# Patient Record
Sex: Female | Born: 1950 | Race: White | Hispanic: No | Marital: Single | Smoking: Former smoker
Health system: Southern US, Community
[De-identification: ages and names within clinical notes are randomized; demographics above are authoritative.]

## PROBLEM LIST (undated history)

## (undated) DIAGNOSIS — G4733 Obstructive sleep apnea (adult) (pediatric): Secondary | ICD-10-CM

## (undated) DIAGNOSIS — I10 Essential (primary) hypertension: Secondary | ICD-10-CM

## (undated) DIAGNOSIS — Z9989 Dependence on other enabling machines and devices: Secondary | ICD-10-CM

## (undated) DIAGNOSIS — D649 Anemia, unspecified: Secondary | ICD-10-CM

## (undated) DIAGNOSIS — F32A Depression, unspecified: Secondary | ICD-10-CM

## (undated) DIAGNOSIS — R011 Cardiac murmur, unspecified: Secondary | ICD-10-CM

## (undated) DIAGNOSIS — E785 Hyperlipidemia, unspecified: Secondary | ICD-10-CM

## (undated) DIAGNOSIS — F329 Major depressive disorder, single episode, unspecified: Secondary | ICD-10-CM

## (undated) DIAGNOSIS — E119 Type 2 diabetes mellitus without complications: Secondary | ICD-10-CM

## (undated) DIAGNOSIS — M1990 Unspecified osteoarthritis, unspecified site: Secondary | ICD-10-CM

## (undated) HISTORY — DX: Obstructive sleep apnea (adult) (pediatric): G47.33

## (undated) HISTORY — PX: OTHER SURGICAL HISTORY: SHX169

## (undated) HISTORY — DX: Morbid (severe) obesity due to excess calories: E66.01

## (undated) HISTORY — DX: Type 2 diabetes mellitus without complications: E11.9

## (undated) HISTORY — DX: Hyperlipidemia, unspecified: E78.5

## (undated) HISTORY — DX: Dependence on other enabling machines and devices: Z99.89

## (undated) HISTORY — PX: DILATION AND CURETTAGE OF UTERUS: SHX78

## (undated) HISTORY — DX: Cardiac murmur, unspecified: R01.1

## (undated) HISTORY — DX: Essential (primary) hypertension: I10

---

## 1975-08-04 HISTORY — PX: TONSILLECTOMY: SUR1361

## 2007-11-15 ENCOUNTER — Ambulatory Visit: Payer: Self-pay

## 2008-03-06 ENCOUNTER — Ambulatory Visit: Payer: Self-pay | Admitting: General Surgery

## 2008-03-06 ENCOUNTER — Other Ambulatory Visit: Payer: Self-pay

## 2008-03-19 ENCOUNTER — Ambulatory Visit: Payer: Self-pay | Admitting: General Surgery

## 2008-08-03 HISTORY — PX: CHOLECYSTECTOMY: SHX55

## 2012-11-11 ENCOUNTER — Other Ambulatory Visit (INDEPENDENT_AMBULATORY_CARE_PROVIDER_SITE_OTHER): Payer: Self-pay

## 2012-11-11 ENCOUNTER — Encounter (INDEPENDENT_AMBULATORY_CARE_PROVIDER_SITE_OTHER): Payer: Self-pay | Admitting: Surgery

## 2012-11-11 ENCOUNTER — Ambulatory Visit (INDEPENDENT_AMBULATORY_CARE_PROVIDER_SITE_OTHER): Payer: BC Managed Care – PPO | Admitting: Surgery

## 2012-11-11 DIAGNOSIS — Z6841 Body Mass Index (BMI) 40.0 and over, adult: Secondary | ICD-10-CM

## 2012-11-11 DIAGNOSIS — E119 Type 2 diabetes mellitus without complications: Secondary | ICD-10-CM

## 2012-11-11 DIAGNOSIS — I10 Essential (primary) hypertension: Secondary | ICD-10-CM

## 2012-11-11 DIAGNOSIS — G4733 Obstructive sleep apnea (adult) (pediatric): Secondary | ICD-10-CM

## 2012-11-11 NOTE — Progress Notes (Signed)
Re:   Leslie Krueger DOB:   03/04/1951 MRN:   [id number]  ASSESSMENT AND PLAN: 1.  Morbid obesity  Weight - 311, BMI - 47  She has already done a 6 month supervised weight loss program in anticipation of surgery by Ninfa Linden, FNP.  Per the 1991 NIH Consensus Statement, the patient is a candidate for bariatric surgery.  The patient attended our initial information session and reviewed the types of bariatric surgery.    The patient is interested in the Roux en Y Gastric Bypass.  I discussed with the patient the indications and risks of bariatric surgery.  The potential risks of surgery include, but are not limited to, bleeding, infection, leak from the bowel, DVT and PE, open surgery, long term nutrition consequences, and death.  The patient understands the importance of compliance and long term follow-up with our group after surgery.  From here we will obtain lab tests, x-rays, nutrition consult, and psych consult.  One issue may be her insurance company and its rules.    2.  Diabetes mellitus - since 12/27/06 3.  History of heart murmur. 4.  Sleep apnea - on CPAP since 27-Dec-2003 5.  Hypertension since 12-27-2003 6.  Gout 7.  Back trouble  Attributed to weight - had PT in [place], but this has been > 6 years ago. 8. Depression 9.  Bursitis/tendonitis - right knee and ankle  [employer] is [employer] of [place] - they only cover this till age 51.  Her birthday is [birth year], which makes for a tight schedule.  Odd]  Chief Complaint  Patient presents with  . Bariatric Pre-op   REFERRING PHYSICIAN: Ninfa Linden, FNP  HISTORY OF PRESENT ILLNESS: Leslie Krueger is a 62 y.o. (DOB: [birth year])  white  female whose primary care physician is Ninfa Linden, FNP and comes to me today for consideration of bariatric surgery.  Patient's husband died in 12-26-04. Her daughter died a few years later for melanoma. She moved from [place] to [place] about 27-Dec-2006.  She has tried multiple diets  including Weight Watchers, [place], Slim fast, diabetic diets, and low-calorie diets. She has tried over-the-counter diet pills. She's had some success with weight loss with this [employer] and the diabetic diets. She just had trouble staying on the diet. She's also had trouble with her right knee and ankle which limit her physical activity.  She has been to one of our information sessions, but she can not remember who spoke. She has a cousin who weighed 400-500 pounds who had gastric bypass surgery. He had a lot of nausea early after the surgery, that has gotten better and has successfully lost more than 200 pounds.    Past Medical History  Diagnosis Date  . Diabetes mellitus without complication   . Heart murmur       Past Surgical History  Procedure Laterality Date  . Tonsillectomy    . Cholecystectomy    . Dilation and curettage of uterus        Current Outpatient Prescriptions  Medication Sig Dispense Refill  . allopurinol (ZYLOPRIM) 300 MG tablet Take 300 mg by mouth daily.      Marland Kitchen aspirin 81 MG tablet Take 81 mg by mouth daily.      Marland Kitchen atorvastatin (LIPITOR) 40 MG tablet Take 40 mg by mouth daily.      . ergocalciferol (VITAMIN D2) 50000 UNITS capsule Take 50,000 Units by mouth once a week.      Marland Kitchen  FLUoxetine (PROZAC) 40 MG capsule Take 40 mg by mouth daily.      . furosemide (LASIX) 40 MG tablet Take 40 mg by mouth daily.      Marland Kitchen lisinopril (PRINIVIL,ZESTRIL) 20 MG tablet Take 20 mg by mouth daily.      . metFORMIN (GLUMETZA) 1000 MG (MOD) 24 hr tablet Take 1,000 mg by mouth daily with breakfast.      . pioglitazone (ACTOS) 30 MG tablet Take 30 mg by mouth daily.      . potassium chloride (K-DUR,KLOR-CON) 10 MEQ tablet Take 10 mEq by mouth 2 (two) times daily.       No current facility-administered medications for this visit.      Allergies  Allergen Reactions  . Mycinette (Phenol) Nausea Only    REVIEW OF SYSTEMS: Skin:  No history of rash.  No history of  abnormal moles. Infection:  No history of hepatitis or HIV.  No history of MRSA. Neurologic:  No history of stroke.  No history of seizure.  No history of headaches. Cardiac:  Hypertension since 12-18-2003.  History of heart murmur.  Had a echo in [place] within the last few months, but nothing significant seen.   No history of seeing a cardiologist. Pulmonary:  Sleep apnea since Dec 18, 2003 - tested again last year.  Endocrine:  Diabetes since 12/18/06.   No thyroid disease. Gastrointestinal:  No history of stomach disease.  No history of liver disease.  Cholecystectomy 2010 in [place].  No history of pancreas disease.  No history of colon disease. Last colonoscopy about 10 years ago. Urologic:  No history of kidney stones.  No history of bladder infections. GYN:  G7, P2.  But daughter died of melanoma at age 47 Musculoskeletal:  Back trouble, but not seeing anyone at this time.  Dismissed as problem secondary to weight. Gout since 12/18/2003 - controled on Allopurinol.  Bursitis/tendonitis - right knee and ankle Hematologic:  No bleeding disorder.  No history of anemia.  Not anticoagulated. Psycho-social:  The patient is oriented.   She suffers from depression.  We talked about how this could affect her weight, though she downplayed it the more we talked.  SOCIAL and FAMILY HISTORY: Single (husband died 12/18/2006) Works in Mudlogger at [employer] One daughter died of melanoma at age 55 Has son who lives in [place] She lives beside her brother. She will bring a niece with her to her next visit.  PHYSICAL EXAM: BP 123/86  Pulse 70  Temp(Src) 97.1 F (36.2 C) (Temporal)  Resp 12  Ht 5\' 8"  (1.727 m)  Wt 311 lb (141.069 kg)  BMI 47.3 kg/m2  General: WN obese WF who is alert and generally healthy appearing.  HEENT: Normal. Pupils equal. Neck: Supple. No mass.  No thyroid mass. Lymph Nodes:  No supraclavicular or cervical nodes. Lungs: Clear to auscultation and symmetric breath sounds. Heart:   RRR. No murmur or rub.  She said they did an echo for her heart murmur, but I don't hear one. Abdomen: Soft. No mass. No tenderness. No hernia. Normal bowel sounds.  She is about 1/2 apple and 1/2 pear. Rectal: No rectal mass.  Guaiac neg stool. Extremities:  Good strength and ROM  in upper and lower extremities. Neurologic:  Grossly intact to motor and sensory function. Psychiatric: Has normal mood and affect. Behavior is normal.   DATA REVIEWED: Notes in chart and labs from K. Cassell Smiles, MD,  [facility], [place] [address]  [address]   [place], [place]    [address] Phone:  [phone] FAX:  [phone]  

## 2012-11-23 ENCOUNTER — Encounter (HOSPITAL_COMMUNITY)
Admission: RE | Disposition: A | Discharge status: Home / Self Care | Payer: Self-pay | Source: Ambulatory Visit | Attending: Surgery

## 2012-11-23 ENCOUNTER — Ambulatory Visit (HOSPITAL_COMMUNITY)
Admission: RE | Admit: 2012-11-23 | Discharge: 2012-11-23 | Disposition: A | Discharge status: Home / Self Care | Payer: BC Managed Care – PPO | Source: Ambulatory Visit | Attending: Surgery | Admitting: Surgery

## 2012-11-23 HISTORY — PX: BREATH TEK H PYLORI: SHX5422

## 2012-11-23 SURGERY — BREATH TEST, FOR HELICOBACTER PYLORI

## 2012-11-24 ENCOUNTER — Encounter (HOSPITAL_COMMUNITY): Payer: Self-pay | Admitting: Surgery

## 2012-11-28 ENCOUNTER — Encounter (INDEPENDENT_AMBULATORY_CARE_PROVIDER_SITE_OTHER): Payer: Self-pay

## 2012-11-29 ENCOUNTER — Ambulatory Visit (HOSPITAL_COMMUNITY)
Admission: RE | Admit: 2012-11-29 | Discharge: 2012-11-29 | Disposition: A | Discharge status: Home / Self Care | Payer: BC Managed Care – PPO | Source: Ambulatory Visit | Attending: Surgery | Admitting: Surgery

## 2012-11-29 ENCOUNTER — Other Ambulatory Visit: Payer: Self-pay

## 2012-11-29 DIAGNOSIS — R011 Cardiac murmur, unspecified: Secondary | ICD-10-CM | POA: Insufficient documentation

## 2012-11-29 DIAGNOSIS — Z9089 Acquired absence of other organs: Secondary | ICD-10-CM | POA: Insufficient documentation

## 2012-11-29 DIAGNOSIS — F3289 Other specified depressive episodes: Secondary | ICD-10-CM | POA: Insufficient documentation

## 2012-11-29 DIAGNOSIS — Z6841 Body Mass Index (BMI) 40.0 and over, adult: Secondary | ICD-10-CM | POA: Insufficient documentation

## 2012-11-29 DIAGNOSIS — G4730 Sleep apnea, unspecified: Secondary | ICD-10-CM | POA: Insufficient documentation

## 2012-11-29 DIAGNOSIS — M76899 Other specified enthesopathies of unspecified lower limb, excluding foot: Secondary | ICD-10-CM | POA: Insufficient documentation

## 2012-11-29 DIAGNOSIS — M109 Gout, unspecified: Secondary | ICD-10-CM | POA: Insufficient documentation

## 2012-11-29 DIAGNOSIS — I10 Essential (primary) hypertension: Secondary | ICD-10-CM | POA: Insufficient documentation

## 2012-11-29 DIAGNOSIS — E119 Type 2 diabetes mellitus without complications: Secondary | ICD-10-CM | POA: Insufficient documentation

## 2012-11-29 DIAGNOSIS — K219 Gastro-esophageal reflux disease without esophagitis: Secondary | ICD-10-CM | POA: Insufficient documentation

## 2012-11-29 DIAGNOSIS — F329 Major depressive disorder, single episode, unspecified: Secondary | ICD-10-CM | POA: Insufficient documentation

## 2012-11-29 DIAGNOSIS — K449 Diaphragmatic hernia without obstruction or gangrene: Secondary | ICD-10-CM | POA: Insufficient documentation

## 2012-11-30 ENCOUNTER — Encounter: Payer: BC Managed Care – PPO | Attending: Surgery | Admitting: *Deleted

## 2012-11-30 ENCOUNTER — Encounter: Payer: Self-pay | Admitting: *Deleted

## 2012-11-30 DIAGNOSIS — Z713 Dietary counseling and surveillance: Secondary | ICD-10-CM | POA: Insufficient documentation

## 2012-11-30 DIAGNOSIS — Z01818 Encounter for other preprocedural examination: Secondary | ICD-10-CM | POA: Insufficient documentation

## 2012-11-30 DIAGNOSIS — E669 Obesity, unspecified: Secondary | ICD-10-CM | POA: Insufficient documentation

## 2012-11-30 NOTE — Progress Notes (Signed)
  Pre-Op Assessment Visit:  Pre-Operative RYGB Surgery  Medical Nutrition Therapy:  Appt start time: 0915   End time:  1015.  Patient was seen on 11/30/2012 for Pre-Operative RYGB Nutrition Assessment. Assessment and letter of approval faxed to [facility] Bariatric Surgery Program coordinator on 11/30/2012.  Approval letter sent to [facility] and will be available in the chart under the media tab.  Handouts given during visit include:  Pre-Op Goals   Bariatric Surgery Protein Shakes  Patient to call for Pre-Op and Post-Op Nutrition Education at [facility] when surgery is scheduled.

## 2012-11-30 NOTE — Patient Instructions (Addendum)
   Follow Pre-Op Nutrition Goals to prepare for Gastric Bypass Surgery.   Call the [facility] at [phone] once you have been given your surgery date to enrolled in the Pre-Op Nutrition Class. You will need to attend this nutrition class 3-4 weeks prior to your surgery. 

## 2012-12-16 ENCOUNTER — Encounter (INDEPENDENT_AMBULATORY_CARE_PROVIDER_SITE_OTHER): Payer: Self-pay

## 2013-03-23 ENCOUNTER — Encounter: Payer: BC Managed Care – PPO | Attending: Surgery | Admitting: *Deleted

## 2013-03-23 DIAGNOSIS — Z713 Dietary counseling and surveillance: Secondary | ICD-10-CM | POA: Insufficient documentation

## 2013-03-23 NOTE — Progress Notes (Addendum)
Bariatric Class:  Appt start time: 0830 end time:  0930.  Pre-Operative Nutrition Class  Patient was seen on 03/23/2013 for Pre-Operative Bariatric Surgery Education at [facility].   Surgery date: 04/11/13 Surgery type: RYGB Start weight at [facility]: 312.2 lbs (11/30/12)  Weight today: 315.0 lbs Weight change: n/a Total weight lost: n/a  TANITA  BODY COMP RESULTS  03/23/13   BMI (kg/m^2) 47.9   Fat Mass (lbs) 163.0   Fat Free Mass (lbs) 152.0   Total Body Water (lbs) 111.0   Samples given per MNT protocol; Patient educated on appropriate usage: Bariatric Advantage Multivitamin Lot # [id number];  Exp: 06/15  Bariatric Advantage Calcium Citrate Lot # [id number]; Exp: 08/15  Bariatric Advantage Sublingual B12 Lot # [id number]; Exp: 10/15  Celebrate Vitamins Multivitamin Lot # [id number]; Exp: 01/16  Celebrate Vitamins Calcium Citrate Lot # [id number];  Exp: 01/16  BariActiv Vitamins Multivitamin Lot # [id number]; Exp: 05/16  BariActiv Vitamins Calcium Citrate Lot # [id number]; Exp: 05/16  BariActiv Vitamins Iron + Vit C/Mg Lot # [id number]; Exp: 05/16  Unjury Protein Powder Lot # [id number]; Exp: 09/15 Lot # [id number]; Exp: 12/15  The following the learning objective met by the patient during this course:  Identify Pre-Op Dietary Goals and will begin 2 weeks pre-operatively  Identify appropriate sources of fluids and proteins   State protein recommendations and appropriate sources pre and post-operatively  Identify Post-Operative Dietary Goals and will follow for 2 weeks post-operatively  Identify appropriate multivitamin and calcium sources  Describe the need for physical activity post-operatively and will follow MD recommendations  State when to call healthcare provider regarding medication questions or post-operative complications  Handouts given during class include:  Pre-Op Bariatric Surgery Diet Handout  Protein Shake Handout  Post-Op Bariatric Surgery  Nutrition Handout  BELT Program Information Flyer  Support Group Information Flyer  [facility] Bariatric Supplements Price List  Follow-Up Plan: Patient will follow-up at [facility] 2 weeks post operatively for diet advancement per MD.

## 2013-03-29 ENCOUNTER — Telehealth (INDEPENDENT_AMBULATORY_CARE_PROVIDER_SITE_OTHER): Payer: Self-pay

## 2013-03-29 NOTE — Telephone Encounter (Signed)
The hospital has been calling and leaving messages for the pt to call to set up a preop appt with [facility]. The pt is not returning the hospitals call. They have called the pt 8/25,8/26,and8/27 but no answer. The pt is scheduled for a RNY on 04/11/13 by Dr Ezzard Standing.

## 2013-03-30 NOTE — Progress Notes (Signed)
Dr Ezzard Standing-   NEED PRE OP ORDERS PLEASE-  HAS PST APPT  04/04/13  [facility]

## 2013-03-31 ENCOUNTER — Encounter (HOSPITAL_COMMUNITY): Payer: Self-pay | Admitting: Pharmacy Technician

## 2013-03-31 NOTE — Patient Instructions (Signed)
DANIELLY ACKERLEY  03/31/2013   Your procedure is scheduled on:  04/11/13               Surgery [id number]  Report to [facility] at   0515  AM.  Call this number if you have problems the morning of surgery: [phone]   Remember:   Do not eat food or drink liquids after midnight.   Take these medicines the morning of surgery with A SIP OF WATER:    Do not wear jewelry,  Do not wear lotions, powders, or perfumes.   Do not shave 48 hours prior to surgery.  Do not bring valuables to the hospital.  Contacts, dentures or bridgework may not be worn into surgery.  Leave suitcase in the car. After surgery it may be brought to your room.  For patients admitted to the hospital, checkout time is 11:00 AM the day of  discharge.     SEE CHG INSTRUCTION SHEET    Please read over the following fact sheets that you were given:  coughing and deep breathing exercises, leg exercises               Failure to comply with these instructions may result in cancellation of your surgery.                Patient Signature ____________________________              Nurse Signature _____________________________

## 2013-03-31 NOTE — Telephone Encounter (Signed)
Patient is aware of Pre admission testing @ [facility] 04/04/13

## 2013-03-31 NOTE — Progress Notes (Signed)
Need orders in EPIC.  Surgery scheduled for 04/11/13.  Preop appointment on 04/04/13 at 100pm.  Thank You.

## 2013-04-04 ENCOUNTER — Encounter (HOSPITAL_COMMUNITY)
Admission: RE | Admit: 2013-04-04 | Discharge: 2013-04-04 | Disposition: A | Discharge status: Home / Self Care | Payer: BC Managed Care – PPO | Source: Ambulatory Visit | Attending: Surgery | Admitting: Surgery

## 2013-04-04 ENCOUNTER — Encounter (HOSPITAL_COMMUNITY): Payer: Self-pay

## 2013-04-04 ENCOUNTER — Ambulatory Visit (HOSPITAL_COMMUNITY)
Admission: RE | Admit: 2013-04-04 | Discharge: 2013-04-04 | Disposition: A | Discharge status: Home / Self Care | Payer: BC Managed Care – PPO | Source: Ambulatory Visit | Attending: Surgery | Admitting: Surgery

## 2013-04-04 DIAGNOSIS — Z01812 Encounter for preprocedural laboratory examination: Secondary | ICD-10-CM | POA: Insufficient documentation

## 2013-04-04 DIAGNOSIS — Z01818 Encounter for other preprocedural examination: Secondary | ICD-10-CM | POA: Insufficient documentation

## 2013-04-04 DIAGNOSIS — I10 Essential (primary) hypertension: Secondary | ICD-10-CM | POA: Insufficient documentation

## 2013-04-04 DIAGNOSIS — E119 Type 2 diabetes mellitus without complications: Secondary | ICD-10-CM | POA: Insufficient documentation

## 2013-04-04 HISTORY — DX: Unspecified osteoarthritis, unspecified site: M19.90

## 2013-04-04 HISTORY — DX: Depression, unspecified: F32.A

## 2013-04-04 HISTORY — DX: Anemia, unspecified: D64.9

## 2013-04-04 HISTORY — DX: Major depressive disorder, single episode, unspecified: F32.9

## 2013-04-04 LAB — CBC
HCT: 38 % (ref 36.0–46.0)
MCHC: 32.6 g/dL (ref 30.0–36.0)
MCV: 91.3 fL (ref 78.0–100.0)
Platelets: 317 10*3/uL (ref 150–400)
RDW: 13.7 % (ref 11.5–15.5)

## 2013-04-04 LAB — COMPREHENSIVE METABOLIC PANEL
Albumin: 4.3 g/dL (ref 3.5–5.2)
BUN: 24 mg/dL — ABNORMAL HIGH (ref 6–23)
Calcium: 10.3 mg/dL (ref 8.4–10.5)
Creatinine, Ser: 1.07 mg/dL (ref 0.50–1.10)
Total Protein: 7.7 g/dL (ref 6.0–8.3)

## 2013-04-04 NOTE — Progress Notes (Signed)
CMp results faxed via EPIC to Dr Raelyn Mora.

## 2013-04-06 ENCOUNTER — Other Ambulatory Visit (INDEPENDENT_AMBULATORY_CARE_PROVIDER_SITE_OTHER): Payer: Self-pay | Admitting: Surgery

## 2013-04-07 ENCOUNTER — Encounter (INDEPENDENT_AMBULATORY_CARE_PROVIDER_SITE_OTHER): Payer: Self-pay | Admitting: Surgery

## 2013-04-07 ENCOUNTER — Ambulatory Visit (INDEPENDENT_AMBULATORY_CARE_PROVIDER_SITE_OTHER): Payer: BC Managed Care – PPO | Admitting: Surgery

## 2013-04-07 DIAGNOSIS — Z6841 Body Mass Index (BMI) 40.0 and over, adult: Secondary | ICD-10-CM

## 2013-04-07 NOTE — Progress Notes (Signed)
 Re:   Leslie Krueger DOB:   01/13/1951 MRN:   [id number]  ASSESSMENT AND PLAN: 1.  Morbid obesity  Weight - 311, BMI - 47  She has already done a 6 month supervised weight loss program in anticipation of surgery by Kathy Patterson, FNP.  Per the 1991 NIH Consensus Statement, the patient is a candidate for bariatric surgery.      The patient is interested in the Roux en Y Gastric Bypass.  I discussed with the patient the indications and risks of bariatric surgery.  The potential risks of surgery include, but are not limited to, bleeding, infection, leak from the bowel, DVT and PE, open surgery, long term nutrition consequences, and death.  The patient understands the importance of compliance and long term follow-up with our group after surgery.  She has completed her pre op workup and is ready for surgery.  She has started her pre op diet.  2.  Diabetes mellitus - since 2008 3.  History of heart murmur. 4.  Sleep apnea - on CPAP since 2005 5.  Hypertension since 2005 6.  Gout 7.  Back trouble  Attributed to weight - had PT in [place], but this has been > 6 years ago. 8. Depression 9.  Bursitis/tendonitis - right knee and ankle  Chief Complaint  Patient presents with  . Bariatric Pre-op   REFERRING PHYSICIAN: PATTERSON, KATHY, FNP  HISTORY OF PRESENT ILLNESS: Leslie Krueger is a 62 y.o. (DOB: 05/04/1951)  white  female whose primary care physician is PATTERSON, KATHY, FNP and comes to me today for pre op visit for RYGB. She has her niece, Jennifer, with her. She is ready for surgery and is scheduled next week.  I again reviewed the operation, the recovery, the hospitalization, and its potential complications.  I think that she understands the process well. She did ask about her EKG which showed some non specific changes.  She has lost about 8 pounds since her last visit. She asked that I call her son, Brian, in [place], [place], after the surgery.  She has seen Sara  Himmelrich for nutrition. Saw Dr. Lurey - 11/24/2012 - approved UGI - 4.29/2014 - small sliding HH  History of weight problem: Patient's husband died in 2006. Her daughter died a few years later for melanoma. She moved from [place] to [place] about 2008.  She has tried multiple diets including Weight Watchers, [place], Slim fast, diabetic diets, and low-calorie diets. She has tried over-the-counter diet pills. She's had some success with weight loss with this [place] diet and the diabetic diets. She just had trouble staying on the diet. She's also had trouble with her right knee and ankle which limit her physical activity.  She has been to one of our information sessions, but she can not remember who spoke. She has a cousin who weighed 400-500 pounds who had gastric bypass surgery. He had a lot of nausea early after the surgery, that has gotten better and has successfully lost more than 200 pounds.    Past Medical History  Diagnosis Date  . Diabetes mellitus without complication   . Heart murmur   . Morbid obesity   . Hyperlipidemia   . Hypertension   . Depression   . OSA on CPAP     settings at 16   . Arthritis   . Anemia       Past Surgical History  Procedure Laterality Date  . Tonsillectomy  1977  . Dilation and   curettage of uterus      x6 - 5 miscarriages/1 fibroids  . Breath tek h pylori N/A 11/23/2012    Procedure: BREATH TEK H PYLORI;  Surgeon: Homer Pfeifer H Precious Gilchrest, MD;  Location: [facility];  Service: General;  Laterality: N/A;  . Cholecystectomy  2010  . Lipoma removed      right elbow     Current Outpatient Prescriptions  Medication Sig Dispense Refill  . allopurinol (ZYLOPRIM) 300 MG tablet Take 300 mg by mouth daily.      . aspirin 81 MG tablet Take 81 mg by mouth daily.      . atorvastatin (LIPITOR) 40 MG tablet Take 40 mg by mouth every morning.       . FLUoxetine (PROZAC) 40 MG capsule Take 40 mg by mouth every morning.       . furosemide (LASIX) 40 MG  tablet Take 40 mg by mouth every morning.       . ibuprofen (ADVIL,MOTRIN) 200 MG tablet Take 800 mg by mouth every 6 (six) hours as needed for pain.      . lisinopril (PRINIVIL,ZESTRIL) 20 MG tablet Take 20 mg by mouth every morning.       . metFORMIN (GLUCOPHAGE-XR) 500 MG 24 hr tablet Take 1,000 mg by mouth daily with breakfast.      . pioglitazone (ACTOS) 30 MG tablet Take 30 mg by mouth every morning.       . potassium chloride (K-DUR,KLOR-CON) 10 MEQ tablet Take 10 mEq by mouth daily.       . VITAMIN D, CHOLECALCIFEROL, PO Take 5,000 Units by mouth daily.       No current facility-administered medications for this visit.      Allergies  Allergen Reactions  . Other Nausea Only    Patient is allergic to mycins - causes nausea   . Erythromycin Nausea Only and Rash    REVIEW OF SYSTEMS: Skin:  No history of rash.  No history of abnormal moles. Infection:  No history of hepatitis or HIV.  No history of MRSA. Neurologic:  No history of stroke.  No history of seizure.  No history of headaches. Cardiac:  Hypertension since 2005.  History of heart murmur.  Had a echo in [place] within the last few months, but nothing significant seen.   No history of seeing a cardiologist. Pulmonary:  Sleep apnea since 2005 - tested again last year.  Endocrine:  Diabetes since 2008.   No thyroid disease. Gastrointestinal:  No history of stomach disease.  No history of liver disease.  Cholecystectomy 2010 in [place].  No history of pancreas disease.  No history of colon disease. Last colonoscopy about 10 years ago. Urologic:  No history of kidney stones.  No history of bladder infections. GYN:  G7, P2.  But daughter died of melanoma at age 281 Musculoskeletal:  Back trouble, but not seeing anyone at this time.  Dismissed as problem secondary to weight. Gout since 2005 - controled on Allopurinol.  Bursitis/tendonitis - right knee and ankle Hematologic:  No bleeding disorder.  No history of anemia.   Not anticoagulated. Psycho-social:  The patient is oriented.   She suffers from depression.  We talked about how this could affect her weight, though she downplayed it the more we talked.  SOCIAL and FAMILY HISTORY: Single (husband died 2008) Works in medical translation at [employer] One daughter died of melanoma at age 28 Has son, Brian,  who lives in [place], [place].  He will   not be here for the surgery. His phone #: [phone]. She lives beside her brother. She has her niece, Jennifer, with her.  PHYSICAL EXAM: BP 142/96  Pulse 88  Resp 16  Ht 5' 8" (1.727 m)  Wt 303 lb 9.6 oz (137.712 kg)  BMI 46.17 kg/m2  General: WN obese WF who is alert and generally healthy appearing.  HEENT: Normal. Pupils equal. Neck: Supple. No mass.  No thyroid mass. Lymph Nodes:  No supraclavicular or cervical nodes. Lungs: Clear to auscultation and symmetric breath sounds. Heart:  RRR. No murmur or rub.  She said they did an echo for her heart murmur, but I don't hear one. Abdomen: Soft. No mass. No tenderness. No hernia. Normal bowel sounds.  She is about 1/2 apple and 1/2 pear. Extremities:  Good strength and ROM  in upper and lower extremities. Neurologic:  Grossly intact to motor and sensory function. Psychiatric: Has normal mood and affect. Behavior is normal.   DATA REVIEWED: Notes in chart and labs from K. Patterson  Samael Blades, MD,  [facility] [address]  [address]   [place], [place]    [address] Phone:  [phone] FAX:  [phone]  

## 2013-04-11 ENCOUNTER — Inpatient Hospital Stay (HOSPITAL_COMMUNITY)
Admission: RE | Admit: 2013-04-11 | Discharge: 2013-04-13 | DRG: 288 | Disposition: A | Discharge status: Home / Self Care | Payer: BC Managed Care – PPO | Source: Ambulatory Visit | Attending: Surgery | Admitting: Surgery

## 2013-04-11 ENCOUNTER — Inpatient Hospital Stay (HOSPITAL_COMMUNITY): Payer: BC Managed Care – PPO | Admitting: Anesthesiology

## 2013-04-11 ENCOUNTER — Encounter (HOSPITAL_COMMUNITY): Payer: Self-pay | Admitting: *Deleted

## 2013-04-11 ENCOUNTER — Encounter (HOSPITAL_COMMUNITY): Payer: Self-pay | Admitting: Anesthesiology

## 2013-04-11 ENCOUNTER — Encounter (HOSPITAL_COMMUNITY)
Admission: RE | Disposition: A | Discharge status: Home / Self Care | Payer: Self-pay | Source: Ambulatory Visit | Attending: Surgery

## 2013-04-11 DIAGNOSIS — E785 Hyperlipidemia, unspecified: Secondary | ICD-10-CM | POA: Diagnosis present

## 2013-04-11 DIAGNOSIS — M129 Arthropathy, unspecified: Secondary | ICD-10-CM | POA: Diagnosis present

## 2013-04-11 DIAGNOSIS — M109 Gout, unspecified: Secondary | ICD-10-CM | POA: Diagnosis present

## 2013-04-11 DIAGNOSIS — Z6841 Body Mass Index (BMI) 40.0 and over, adult: Secondary | ICD-10-CM

## 2013-04-11 DIAGNOSIS — E119 Type 2 diabetes mellitus without complications: Secondary | ICD-10-CM

## 2013-04-11 DIAGNOSIS — I10 Essential (primary) hypertension: Secondary | ICD-10-CM | POA: Diagnosis present

## 2013-04-11 DIAGNOSIS — F329 Major depressive disorder, single episode, unspecified: Secondary | ICD-10-CM | POA: Diagnosis present

## 2013-04-11 DIAGNOSIS — Z79899 Other long term (current) drug therapy: Secondary | ICD-10-CM

## 2013-04-11 DIAGNOSIS — Z791 Long term (current) use of non-steroidal anti-inflammatories (NSAID): Secondary | ICD-10-CM

## 2013-04-11 DIAGNOSIS — G4733 Obstructive sleep apnea (adult) (pediatric): Secondary | ICD-10-CM

## 2013-04-11 DIAGNOSIS — M779 Enthesopathy, unspecified: Secondary | ICD-10-CM | POA: Diagnosis present

## 2013-04-11 DIAGNOSIS — M7150 Other bursitis, not elsewhere classified, unspecified site: Secondary | ICD-10-CM | POA: Diagnosis present

## 2013-04-11 DIAGNOSIS — F3289 Other specified depressive episodes: Secondary | ICD-10-CM | POA: Diagnosis present

## 2013-04-11 DIAGNOSIS — R011 Cardiac murmur, unspecified: Secondary | ICD-10-CM | POA: Diagnosis present

## 2013-04-11 HISTORY — PX: GASTRIC ROUX-EN-Y: SHX5262

## 2013-04-11 LAB — GLUCOSE, CAPILLARY
Glucose-Capillary: 170 mg/dL — ABNORMAL HIGH (ref 70–99)
Glucose-Capillary: 120 mg/dL — ABNORMAL HIGH (ref 70–99)
Glucose-Capillary: 133 mg/dL — ABNORMAL HIGH (ref 70–99)

## 2013-04-11 SURGERY — LAPAROSCOPIC ROUX-EN-Y GASTRIC BYPASS WITH UPPER ENDOSCOPY
Anesthesia: General | Site: Abdomen | Wound class: Clean

## 2013-04-11 MED ORDER — DEXTROSE 5 % IV SOLN
2.0000 g | INTRAVENOUS | Status: AC
  Administered 2013-04-11: 2 g via INTRAVENOUS
  Filled 2013-04-11: qty 2

## 2013-04-11 MED ORDER — ACETAMINOPHEN 160 MG/5ML PO SOLN: 650.0000 mg | ORAL | Status: DC | PRN

## 2013-04-11 MED ORDER — INSULIN ASPART 100 UNIT/ML ~~LOC~~ SOLN: 0.0000 [IU] | Freq: Three times a day (TID) | SUBCUTANEOUS | Status: DC

## 2013-04-11 MED ORDER — ROCURONIUM BROMIDE 100 MG/10ML IV SOLN
INTRAVENOUS | Status: DC | PRN
  Administered 2013-04-11 (×2): 10 mg via INTRAVENOUS
  Administered 2013-04-11: 50 mg via INTRAVENOUS
  Administered 2013-04-11 (×2): 20 mg via INTRAVENOUS
  Administered 2013-04-11: 10 mg via INTRAVENOUS

## 2013-04-11 MED ORDER — UNJURY CHICKEN SOUP POWDER: 2.0000 [oz_av] | Freq: Four times a day (QID) | ORAL | Status: DC

## 2013-04-11 MED ORDER — OXYCODONE-ACETAMINOPHEN 5-325 MG/5ML PO SOLN
5.0000 mL | ORAL | Status: DC | PRN
  Administered 2013-04-12 – 2013-04-13 (×3): 10 mL via ORAL
  Filled 2013-04-11 (×3): qty 10

## 2013-04-11 MED ORDER — LACTATED RINGERS IV SOLN
INTRAVENOUS | Status: DC | PRN
  Administered 2013-04-11 (×3): via INTRAVENOUS

## 2013-04-11 MED ORDER — HYDROMORPHONE HCL PF 1 MG/ML IJ SOLN
0.2500 mg | INTRAMUSCULAR | Status: DC | PRN
Start: 2013-04-11 — End: 2013-04-11
  Administered 2013-04-11 (×2): 0.5 mg via INTRAVENOUS

## 2013-04-11 MED ORDER — TISSEEL VH 10 ML EX KIT
PACK | CUTANEOUS | Status: AC
  Filled 2013-04-11: qty 1

## 2013-04-11 MED ORDER — HYDROMORPHONE HCL PF 1 MG/ML IJ SOLN
INTRAMUSCULAR | Status: DC | PRN
  Administered 2013-04-11 (×2): 1 mg via INTRAVENOUS

## 2013-04-11 MED ORDER — GLYCOPYRROLATE 0.2 MG/ML IJ SOLN
INTRAMUSCULAR | Status: DC | PRN
  Administered 2013-04-11: .7 mg via INTRAVENOUS

## 2013-04-11 MED ORDER — FENTANYL CITRATE 0.05 MG/ML IJ SOLN
INTRAMUSCULAR | Status: DC | PRN
  Administered 2013-04-11: 50 ug via INTRAVENOUS
  Administered 2013-04-11: 100 ug via INTRAVENOUS
  Administered 2013-04-11 (×7): 50 ug via INTRAVENOUS

## 2013-04-11 MED ORDER — TISSEEL VH 10 ML EX KIT
PACK | CUTANEOUS | Status: DC | PRN
  Administered 2013-04-11: 10 mL

## 2013-04-11 MED ORDER — INSULIN ASPART 100 UNIT/ML ~~LOC~~ SOLN
0.0000 [IU] | SUBCUTANEOUS | Status: DC
  Administered 2013-04-11: 3 [IU] via SUBCUTANEOUS

## 2013-04-11 MED ORDER — DEXTROSE 5 % IV SOLN
INTRAVENOUS | Status: AC
  Filled 2013-04-11 (×2): qty 1

## 2013-04-11 MED ORDER — LACTATED RINGERS IR SOLN
Status: DC | PRN
  Administered 2013-04-11: 3000 mL

## 2013-04-11 MED ORDER — HEPARIN SODIUM (PORCINE) 5000 UNIT/ML IJ SOLN
5000.0000 [IU] | INTRAMUSCULAR | Status: AC
  Administered 2013-04-11: 5000 [IU] via SUBCUTANEOUS
  Filled 2013-04-11: qty 1

## 2013-04-11 MED ORDER — HYDROMORPHONE HCL PF 1 MG/ML IJ SOLN
INTRAMUSCULAR | Status: AC
  Filled 2013-04-11: qty 1

## 2013-04-11 MED ORDER — UNJURY CHOCOLATE CLASSIC POWDER
2.0000 [oz_av] | Freq: Four times a day (QID) | ORAL | Status: DC
  Administered 2013-04-13: 2 [oz_av] via ORAL

## 2013-04-11 MED ORDER — EPHEDRINE SULFATE 50 MG/ML IJ SOLN
INTRAMUSCULAR | Status: DC | PRN
  Administered 2013-04-11: 5 mg via INTRAVENOUS

## 2013-04-11 MED ORDER — LIDOCAINE HCL (CARDIAC) 20 MG/ML IV SOLN
INTRAVENOUS | Status: DC | PRN
  Administered 2013-04-11: 50 mg via INTRAVENOUS

## 2013-04-11 MED ORDER — POTASSIUM CHLORIDE IN NACL 20-0.45 MEQ/L-% IV SOLN
INTRAVENOUS | Status: DC
  Administered 2013-04-11 – 2013-04-13 (×6): via INTRAVENOUS
  Filled 2013-04-11 (×10): qty 1000

## 2013-04-11 MED ORDER — PROPOFOL 10 MG/ML IV BOLUS
INTRAVENOUS | Status: DC | PRN
  Administered 2013-04-11: 200 mg via INTRAVENOUS

## 2013-04-11 MED ORDER — UNJURY VANILLA POWDER: 2.0000 [oz_av] | Freq: Four times a day (QID) | ORAL | Status: DC

## 2013-04-11 MED ORDER — ONDANSETRON HCL 4 MG/2ML IJ SOLN
INTRAMUSCULAR | Status: DC | PRN
  Administered 2013-04-11: 4 mg via INTRAVENOUS

## 2013-04-11 MED ORDER — MIDAZOLAM HCL 5 MG/5ML IJ SOLN
INTRAMUSCULAR | Status: DC | PRN
  Administered 2013-04-11: 2 mg via INTRAVENOUS

## 2013-04-11 MED ORDER — KETOROLAC TROMETHAMINE 30 MG/ML IJ SOLN: 15.0000 mg | Freq: Once | INTRAMUSCULAR | Status: DC | PRN

## 2013-04-11 MED ORDER — PROMETHAZINE HCL 25 MG/ML IJ SOLN: 6.2500 mg | INTRAMUSCULAR | Status: DC | PRN

## 2013-04-11 MED ORDER — ONDANSETRON HCL 4 MG/2ML IJ SOLN: 4.0000 mg | INTRAMUSCULAR | Status: DC | PRN

## 2013-04-11 MED ORDER — HEPARIN SODIUM (PORCINE) 5000 UNIT/ML IJ SOLN
5000.0000 [IU] | Freq: Three times a day (TID) | INTRAMUSCULAR | Status: DC
  Administered 2013-04-11 – 2013-04-13 (×5): 5000 [IU] via SUBCUTANEOUS
  Filled 2013-04-11 (×8): qty 1

## 2013-04-11 MED ORDER — SUCCINYLCHOLINE CHLORIDE 20 MG/ML IJ SOLN
INTRAMUSCULAR | Status: DC | PRN
  Administered 2013-04-11: 200 mg via INTRAVENOUS

## 2013-04-11 MED ORDER — MORPHINE SULFATE 2 MG/ML IJ SOLN
2.0000 mg | INTRAMUSCULAR | Status: DC | PRN
  Administered 2013-04-11: 4 mg via INTRAVENOUS
  Administered 2013-04-11: 2 mg via INTRAVENOUS
  Administered 2013-04-12 (×2): 4 mg via INTRAVENOUS
  Filled 2013-04-11: qty 2
  Filled 2013-04-11: qty 1
  Filled 2013-04-11 (×2): qty 2

## 2013-04-11 MED ORDER — NEOSTIGMINE METHYLSULFATE 1 MG/ML IJ SOLN
INTRAMUSCULAR | Status: DC | PRN
  Administered 2013-04-11: 4.5 mg via INTRAVENOUS

## 2013-04-11 MED ORDER — BUPIVACAINE HCL (PF) 0.25 % IJ SOLN
INTRAMUSCULAR | Status: AC
  Filled 2013-04-11: qty 30

## 2013-04-11 SURGICAL SUPPLY — 69 items
APPLICATOR COTTON TIP 6IN STRL (MISCELLANEOUS) IMPLANT
BLADE SURG 15 STRL LF DISP TIS (BLADE) ×1 IMPLANT
BLADE SURG 15 STRL SS (BLADE) ×1
CABLE HIGH FREQUENCY MONO STRZ (ELECTRODE) IMPLANT
CANISTER SUCTION 2500CC (MISCELLANEOUS) ×2 IMPLANT
CHLORAPREP W/TINT 26ML (MISCELLANEOUS) ×4 IMPLANT
CLIP SUT LAPRA TY ABSORB (SUTURE) ×4 IMPLANT
CLOTH BEACON ORANGE TIMEOUT ST (SAFETY) ×2 IMPLANT
CUTTER LINEAR ENDO ART 45 ETS (STAPLE) ×2 IMPLANT
DECANTER SPIKE VIAL GLASS SM (MISCELLANEOUS) IMPLANT
DERMABOND ADVANCED (GAUZE/BANDAGES/DRESSINGS) ×1
DERMABOND ADVANCED .7 DNX12 (GAUZE/BANDAGES/DRESSINGS) ×1 IMPLANT
DEVICE SUTURE ENDOST 10MM (ENDOMECHANICALS) ×2 IMPLANT
DISSECTOR BLUNT TIP ENDO 5MM (MISCELLANEOUS) IMPLANT
DRAIN PENROSE 18X1/4 LTX STRL (WOUND CARE) ×2 IMPLANT
DRAPE CAMERA CLOSED 9X96 (DRAPES) ×2 IMPLANT
DRAPE UTILITY XL STRL (DRAPES) ×2 IMPLANT
DUPLOJECT EASY PREP 4ML (MISCELLANEOUS) ×2 IMPLANT
GAUZE SPONGE 4X4 16PLY XRAY LF (GAUZE/BANDAGES/DRESSINGS) ×2 IMPLANT
GLOVE BIOGEL PI IND STRL 7.0 (GLOVE) ×3 IMPLANT
GLOVE BIOGEL PI INDICATOR 7.0 (GLOVE) ×3
GLOVE SS BIOGEL STRL SZ 7 (GLOVE) ×2 IMPLANT
GLOVE SUPERSENSE BIOGEL SZ 5.5 (GLOVE) ×4 IMPLANT
GLOVE SUPERSENSE BIOGEL SZ 7 (GLOVE) ×2
GLOVE SURG SIGNA 7.5 PF LTX (GLOVE) ×4 IMPLANT
GLOVE SURG SS PI 7.0 STRL IVOR (GLOVE) ×4 IMPLANT
GOWN STRL NON-REIN LRG LVL3 (GOWN DISPOSABLE) ×4 IMPLANT
GOWN STRL REIN XL XLG (GOWN DISPOSABLE) ×12 IMPLANT
HOVERMATT SINGLE USE (MISCELLANEOUS) ×2 IMPLANT
KIT BASIN OR (CUSTOM PROCEDURE TRAY) ×2 IMPLANT
KIT GASTRIC LAVAGE 34FR ADT (SET/KITS/TRAYS/PACK) ×2 IMPLANT
MARKER SKIN DUAL TIP RULER LAB (MISCELLANEOUS) ×2 IMPLANT
NEEDLE SPNL 22GX3.5 QUINCKE BK (NEEDLE) ×2 IMPLANT
NS IRRIG 1000ML POUR BTL (IV SOLUTION) ×2 IMPLANT
PACK CARDIOVASCULAR III (CUSTOM PROCEDURE TRAY) ×2 IMPLANT
POUCH SPECIMEN RETRIEVAL 10MM (ENDOMECHANICALS) IMPLANT
RELOAD 45 VASCULAR/THIN (ENDOMECHANICALS) ×4 IMPLANT
RELOAD BLUE (STAPLE) IMPLANT
RELOAD ENDO STITCH 2.0 (ENDOMECHANICALS) ×12
RELOAD GOLD (STAPLE) IMPLANT
RELOAD STAPLE TA45 3.5 REG BLU (ENDOMECHANICALS) ×4 IMPLANT
RELOAD WHITE ECR60W (STAPLE) IMPLANT
SCALPEL HARMONIC ACE (MISCELLANEOUS) ×2 IMPLANT
SCISSORS LAP 5X35 DISP (ENDOMECHANICALS) ×2 IMPLANT
SEALANT SURGICAL APPL DUAL CAN (MISCELLANEOUS) ×2 IMPLANT
SET IRRIG TUBING LAPAROSCOPIC (IRRIGATION / IRRIGATOR) ×2 IMPLANT
SLEEVE ENDOPATH XCEL 5M (ENDOMECHANICALS) ×4 IMPLANT
SOLUTION ANTI FOG 6CC (MISCELLANEOUS) ×2 IMPLANT
SPONGE GAUZE 4X4 12PLY (GAUZE/BANDAGES/DRESSINGS) ×2 IMPLANT
STAPLE ECHEON FLEX 60 POW ENDO (STAPLE) ×2 IMPLANT
STAPLER VISISTAT 35W (STAPLE) ×2 IMPLANT
SUT RELOAD ENDO STITCH 2 48X1 (ENDOMECHANICALS) ×7
SUT RELOAD ENDO STITCH 2.0 (ENDOMECHANICALS) ×5
SUT VIC AB 2-0 SH 27 (SUTURE) ×1
SUT VIC AB 2-0 SH 27X BRD (SUTURE) ×1 IMPLANT
SUTURE RELOAD END STTCH 2 48X1 (ENDOMECHANICALS) ×7 IMPLANT
SUTURE RELOAD ENDO STITCH 2.0 (ENDOMECHANICALS) ×5 IMPLANT
SYR 20CC LL (SYRINGE) ×2 IMPLANT
SYR 50ML LL SCALE MARK (SYRINGE) ×2 IMPLANT
SYR CONTROL 10ML LL (SYRINGE) ×2 IMPLANT
TOWEL OR 17X26 10 PK STRL BLUE (TOWEL DISPOSABLE) ×2 IMPLANT
TRAY FOLEY CATH 14FRSI W/METER (CATHETERS) ×2 IMPLANT
TROCAR BLADELESS OPT 5 100 (ENDOMECHANICALS) ×2 IMPLANT
TROCAR ENDOPATH XCEL 12X100 BL (ENDOMECHANICALS) ×2 IMPLANT
TROCAR XCEL 12X100 BLDLESS (ENDOMECHANICALS) ×2 IMPLANT
TROCAR XCEL NON-BLD 11X100MML (ENDOMECHANICALS) ×2 IMPLANT
TUBING ENDO SMARTCAP (MISCELLANEOUS) ×2 IMPLANT
TUBING FILTER THERMOFLATOR (ELECTROSURGICAL) ×2 IMPLANT
WATER STERILE IRR 1500ML POUR (IV SOLUTION) ×2 IMPLANT

## 2013-04-11 NOTE — Op Note (Signed)
PATIENT:   Leslie Krueger DOB:   [birth year] MRN:   [id number]  DATE OF PROCEDURE: 04/11/2013                   FACILITY:  [facility]  OPERATIVE REPORT  PREOPERATIVE DIAGNOSIS:  Morbid obesity.  POSTOPERATIVE DIAGNOSIS:  Morbid obesity (weight 311, BMI of 47).  PROCEDURE:  Laparoscopic Roux-en-Y gastric bypass (intraoperative upper endoscopy by Dr. Leonard Schwartz. Hoxworth)  SURGEON:  Sandria Bales. Ezzard Standing, MD  FIRST ASSISTANT:  Dr. Leonard Schwartz. Hoxworth  ANESTHESIA:  General endotracheal.  Anesthesiologist: Leslie Ghazi, MD CRNA: Leslie Krueger; Leslie Lore, CRNA  General  ESTIMATED BLOOD LOSS:  Minimal.  LOCAL ANESTHESIA:  30 cc of 1/4% Marcaine  COMPLICATIONS:  None.  INDICATION FOR SURGERY:  Leslie Krueger is a 62 y.o. white  female who sees Leslie Krueger, Leslie Krueger as her primary care doctor.  She has completed our preoperative bariatric program and now comes for a laparoscopic Roux-en-Y gastric bypass.  The indications, potential complications of surgery were explained to the patient.  Potential complications of the surgery include, but are not limited to, bleeding, infection, DVT, open surgery, and long-term nutritional consequences.  OPERATIVE NOTE:  The patient taken to room #1 at [facility] where Leslie Krueger underwent a general endotracheal anesthetic, supervised by Anesthesiologist: Leslie Ghazi, MD CRNA: Leslie Krueger; Leslie Lore, CRNA.  The patient was given 2 g of cefoxitin at the beginning of the procedure.  A time-out was held and surgical checklist run.  The abdomen was prepped with ChloraPrep and sterilely draped.  I accessed the abdominal cavity through the left upper quadrant using a 12 mm Optiview trocar.  I placed 6 additional trocars: 5 mm subxiphoid, 12 mm right subcostal, 12 mm right paramedian, 12 mm left paramedian, 5 mm lateral subcostal, and a 11 mm below to the right of the umbilicus.  The abdomen was insufflated and abdominal exploration carried out.  Right and left  lobes of liver unremarkable.  The stomach that I could see was unremarkable.  The patient had a moderate amount of greater omentum which draped over the bowel.  I was able to push the omentum and transverse colon up and identified the ligament of Treitz to start the operation.  I measured 40 cm of the jejunum, starting at the ligament of Tritz, and divided the jejunum with a white load of 45 mm Ethicon Endo-GIA stapler.  I divided a short length into the mesentery.  I measured 100 cm of jejunum for the future gastric limb.  I put a Penrose drain on the future gastric limb of the jejunum.  I then did a side-to-side jejunojejunostomy.  I used a 45 mm white load of the Ethicon Endo-GIA stapler.  I closed the enterotomy with 2 running 2-0 Vicryl sutures.  I tested the JJ anastomosis with an alligator forceps and then covered this with Tisseel.  I closed the mesenteric defect with a running 2-0 silk suture with a Laparo-tye on each end.  I then divided the omentum with a Harmonic Scalpel.  I positioned the patient in reverse Trendelenburg and placed the liver retractor, which was introduced into the peritoneal cavity through a subxiphoid 5 mm trocar puncture, under the left lobe of the liver.  I then identified the gastroesophageal junction.  I went to the left at the angle of His and made a window at the left side fo the esophago-gastric junction for a target as my dissection.  I then went on the  lesser curve of the stomach, measured 5 cm from the gastroesophageal junction down the lesser curve and dissected into the lesser sac from the lesser curvature side of the stomach.  I did the first firing of a 45 mm blue load Ethicon Endo-GIA stapler and then did 3 firings of the 60 mm blue load Ethicon Eschelon stapler.  This created a gastric pouch approximately 5 cm in length and 3 cm in width.  There was no bleeding from either the pouch or the stomach remnant site.  I placed Tisseel on the pouch side along  the new greater curvature.  I over sewed the gastric remnant with a locking 2-0 Vicryl suture with a Laparo-tye on each end..  I then brought the jejunum ante-colic, ante-gastric up to the new stomach pouch and placed a posterior running 2-0 Vicryl suture.  I then made an enterotomy into the stomach using the Ewald as a back stop and an enterotomy into the jejunum.  I did a stapled side-to-side gastrojejunal anastomosis using these two enterotomies with a 45 mm blue load of the Ethicon Endo GIA stapler.  I tried to create a 2.5 cm gastrojejunal anastomosis.  I closed the enterotomy with a 2 running 2-0 Vicryl sutures.  I passed the Ewald tube through the gastrojejunal anastomosis and then did an anterior Connell suture running of 2-0 Vicryl suture for the anterior layer of the gastrojejunostomy.  The Ewald tube was then removed without difficulty.  I then closed the Fraser defect with a figure-of-eight 2-0 silk suture between the mesentery of the transverse colon and the mesentery of the distal jejunum.  Dr. Johna Krueger then scrubbed out and did an intraoperative upper endoscopy.  He identified the esophagogastric junction about 40 cm, the gastrojejunal anastomosis about 45 cm.  I clamped off the small bowel.  He insufflated air and I flooded the abdomen with saline. There was no bubbling or evidence of air leak.  He then withdrew the scope and he will dictate that portion of the operation.    I then re-inspected the anastomoses, sucked out the saline, placed Tisseel over the stomach pouch and gastrojejunal anastomosis.   The liver retractor was removed.  The trocars were removed.  There was no bleeding at any trocar site.  The skin at each trocar site was closed with a 5-0 Monocryl suture.  I infiltrated a total about 30 cc of 0.25% Marcaine at the trocar sites.    After the skin incisions were closed with sutures they were painted with Dermabond.  The sponge and needle count were correct at the end of  the case.  The patient tolerated the procedure well, was transported to the recovery room in good condition.   Ovidio Kin, MD, [facility] Pager: [phone] Office phone:  [phone]

## 2013-04-11 NOTE — Transfer of Care (Signed)
Immediate Anesthesia Transfer of Care Note  Patient: Leslie Krueger  Procedure(s) Performed: Procedure(s): LAPAROSCOPIC ROUX-EN-Y GASTRIC BYPASS WITH UPPER ENDOSCOPY (N/A)  Patient Location: PACU  Anesthesia Type:General  Level of Consciousness: awake, alert , oriented, patient cooperative and responds to stimulation  Airway & Oxygen Therapy: Patient Spontanous Breathing and Patient connected to face mask oxygen  Post-op Assessment: Report given to PACU RN, Post -op Vital signs reviewed and stable and Patient moving all extremities X 4  Post vital signs: Reviewed and stable  Complications: No apparent anesthesia complications

## 2013-04-11 NOTE — H&P (View-Only) (Signed)
Re:   Leslie Krueger DOB:   03-23-1951 MRN:   [id number]  ASSESSMENT AND PLAN: 1.  Morbid obesity  Weight - 311, BMI - 47  She has already done a 6 month supervised weight loss program in anticipation of surgery by Leslie Linden, FNP.  Per the 1991 NIH Consensus Statement, the patient is a candidate for bariatric surgery.      The patient is interested in the Roux en Y Gastric Bypass.  I discussed with the patient the indications and risks of bariatric surgery.  The potential risks of surgery include, but are not limited to, bleeding, infection, leak from the bowel, DVT and PE, open surgery, long term nutrition consequences, and death.  The patient understands the importance of compliance and long term follow-up with our group after surgery.  She has completed her pre op workup and is ready for surgery.  She has started her pre op diet.  2.  Diabetes mellitus - since 12/16/06 3.  History of heart murmur. 4.  Sleep apnea - on CPAP since 12/16/2003 5.  Hypertension since 60-May-2005 6.  Gout 7.  Back trouble  Attributed to weight - had PT in [place], but this has been > 6 years ago. 8. Depression 9.  Bursitis/tendonitis - right knee and ankle  Chief Complaint  Patient presents with  . Bariatric Pre-op   REFERRING PHYSICIAN: Ninfa Linden, FNP  HISTORY OF PRESENT ILLNESS: Leslie Krueger is a 62 y.o. (DOB: Oct 27, 1950)  white  female whose primary care physician is Leslie Linden, FNP and comes to me today for pre op visit for RYGB. She has her niece, Leslie Krueger, with her. She is ready for surgery and is scheduled next week.  I again reviewed the operation, the recovery, the hospitalization, and its potential complications.  I think that she understands the process well. She did ask about her EKG which showed some non specific changes.  She has lost about 8 pounds since her last visit. She asked that I call her son, Leslie Krueger, in [place], [place], after the surgery.  She has seen Leslie Dec  Krueger for nutrition. Saw Dr. Cyndia Krueger - 11/24/2012 - approved UGI - 4.29/2014 - small sliding HH  History of weight problem: Patient's husband died in Dec 15, 2004. Her daughter died a few years later for melanoma. She moved from [place] to [place] about Dec 16, 2006.  She has tried multiple diets including Weight Watchers, [place], Slim fast, diabetic diets, and low-calorie diets. She has tried over-the-counter diet pills. She's had some success with weight loss with this [employer] and the diabetic diets. She just had trouble staying on the diet. She's also had trouble with her right knee and ankle which limit her physical activity.  She has been to one of our information sessions, but she can not remember who spoke. She has a cousin who weighed 400-500 pounds who had gastric bypass surgery. He had a lot of nausea early after the surgery, that has gotten better and has successfully lost more than 200 pounds.    Past Medical History  Diagnosis Date  . Diabetes mellitus without complication   . Heart murmur   . Morbid obesity   . Hyperlipidemia   . Hypertension   . Depression   . OSA on CPAP     settings at 16   . Arthritis   . Anemia       Past Surgical History  Procedure Laterality Date  . Tonsillectomy  1977  . Dilation and  curettage of uterus      x6 - 5 miscarriages/1 fibroids  . Breath tek h pylori N/A 11/23/2012    Procedure: BREATH TEK H PYLORI;  Surgeon: Leslie Cocking, MD;  [facility];  Service: General;  Laterality: N/A;  . Cholecystectomy  2008-12-28  . Lipoma removed      right elbow     Current Outpatient Prescriptions  Medication Sig Dispense Refill  . allopurinol (ZYLOPRIM) 300 MG tablet Take 300 mg by mouth daily.      Marland Kitchen aspirin 81 MG tablet Take 81 mg by mouth daily.      Marland Kitchen atorvastatin (LIPITOR) 40 MG tablet Take 40 mg by mouth every morning.       Marland Kitchen FLUoxetine (PROZAC) 40 MG capsule Take 40 mg by mouth every morning.       . furosemide (LASIX) 40 MG  tablet Take 40 mg by mouth every morning.       Marland Kitchen ibuprofen (ADVIL,MOTRIN) 200 MG tablet Take 800 mg by mouth every 6 (six) hours as needed for pain.      Marland Kitchen lisinopril (PRINIVIL,ZESTRIL) 20 MG tablet Take 20 mg by mouth every morning.       . metFORMIN (GLUCOPHAGE-XR) 500 MG 24 hr tablet Take 1,000 mg by mouth daily with breakfast.      . pioglitazone (ACTOS) 30 MG tablet Take 30 mg by mouth every morning.       . potassium chloride (K-DUR,KLOR-CON) 10 MEQ tablet Take 10 mEq by mouth daily.       Marland Kitchen VITAMIN D, CHOLECALCIFEROL, PO Take 5,000 Units by mouth daily.       No current facility-administered medications for this visit.      Allergies  Allergen Reactions  . Other Nausea Only    Patient is allergic to mycins - causes nausea   . Erythromycin Nausea Only and Rash    REVIEW OF SYSTEMS: Skin:  No history of rash.  No history of abnormal moles. Infection:  No history of hepatitis or HIV.  No history of MRSA. Neurologic:  No history of stroke.  No history of seizure.  No history of headaches. Cardiac:  Hypertension since 60/28/05.  History of heart murmur.  Had a echo in [place] within the last few months, but nothing significant seen.   No history of seeing a cardiologist. Pulmonary:  Sleep apnea since 62-28-2005 - tested again last year.  Endocrine:  Diabetes since 62-05-28.   No thyroid disease. Gastrointestinal:  No history of stomach disease.  No history of liver disease.  Cholecystectomy 2010 in [place].  No history of pancreas disease.  No history of colon disease. Last colonoscopy about 10 years ago. Urologic:  No history of kidney stones.  No history of bladder infections. GYN:  G7, P2.  But daughter died of melanoma at age 82 Musculoskeletal:  Back trouble, but not seeing anyone at this time.  Dismissed as problem secondary to weight. Gout since 60/28/05 - controled on Allopurinol.  Bursitis/tendonitis - right knee and ankle Hematologic:  No bleeding disorder.  No history of anemia.   Not anticoagulated. Psycho-social:  The patient is oriented.   She suffers from depression.  We talked about how this could affect her weight, though she downplayed it the more we talked.  SOCIAL and FAMILY HISTORY: Single (husband died 12-29-2006) Works in Mudlogger at [employer] One daughter died of melanoma at age 36 Has son, Leslie Krueger,  who lives in [place], [place].  He will  not be here for the surgery. His phone #: [phone]. She lives beside her brother. She has her niece, Leslie Krueger, with her.  PHYSICAL EXAM: BP 142/96  Pulse 88  Resp 16  Ht 5\' 8"  (1.727 m)  Wt 303 lb 9.6 oz (137.712 kg)  BMI 46.17 kg/m2  General: WN obese WF who is alert and generally healthy appearing.  HEENT: Normal. Pupils equal. Neck: Supple. No mass.  No thyroid mass. Lymph Nodes:  No supraclavicular or cervical nodes. Lungs: Clear to auscultation and symmetric breath sounds. Heart:  RRR. No murmur or rub.  She said they did an echo for her heart murmur, but I don't hear one. Abdomen: Soft. No mass. No tenderness. No hernia. Normal bowel sounds.  She is about 1/2 apple and 1/2 pear. Extremities:  Good strength and ROM  in upper and lower extremities. Neurologic:  Grossly intact to motor and sensory function. Psychiatric: Has normal mood and affect. Behavior is normal.   DATA REVIEWED: Notes in chart and labs from K. Cassell Smiles, MD,  [facility] [address] [place]  [address]   [place], [place]    [address] Phone:  [phone] FAX:  [phone]

## 2013-04-11 NOTE — Anesthesia Preprocedure Evaluation (Addendum)
Anesthesia Evaluation  Patient identified by MRN, date of birth, ID band Patient awake    Reviewed: Allergy & Precautions, H&P , NPO status , Patient's Chart, lab work & pertinent test results  Airway Mallampati: IV TM Distance: <3 FB Neck ROM: Full    Dental no notable dental hx.    Pulmonary sleep apnea ,  breath sounds clear to auscultation  + decreased breath sounds      Cardiovascular hypertension, Pt. on medications Rhythm:Regular Rate:Normal     Neuro/Psych negative neurological ROS  negative psych ROS   GI/Hepatic negative GI ROS, Neg liver ROS,   Endo/Other  diabetes, Type obesity  Renal/GU negative Renal ROS  negative genitourinary   Musculoskeletal negative musculoskeletal ROS (+)   Abdominal   Peds negative pediatric ROS (+)  Hematology negative hematology ROS (+)   Anesthesia Other Findings   Reproductive/Obstetrics negative OB ROS                          Anesthesia Physical Anesthesia Plan  ASA: III  Anesthesia Plan: General   Post-op Pain Management:    Induction: Intravenous  Airway Management Planned: Oral ETT  Additional Equipment:   Intra-op Plan:   Post-operative Plan: Extubation in OR  Informed Consent: I have reviewed the patients History and Physical, chart, labs and discussed the procedure including the risks, benefits and alternatives for the proposed anesthesia with the patient or authorized representative who has indicated his/her understanding and acceptance.   Dental advisory given  Plan Discussed with: CRNA and Surgeon  Anesthesia Plan Comments:         Anesthesia Quick Evaluation

## 2013-04-11 NOTE — Interval H&P Note (Signed)
History and Physical Interval Note:  04/11/2013 7:05 AM  Leslie Krueger  has presented today for surgery, with the diagnosis of MORBID OBESITY   The various methods of treatment have been discussed with the patient and family.  I told her I would call her son in [place] after the case.  Otherwise, no one is with her.  After consideration of risks, benefits and other options for treatment, the patient has consented to  Procedure(s): LAPAROSCOPIC ROUX-EN-Y GASTRIC BYPASS WITH UPPER ENDOSCOPY (N/A) as a surgical intervention .    The patient's history has been reviewed, patient examined, no change in status, stable for surgery.  I have reviewed the patient's chart and labs.  Questions were answered to the patient's satisfaction.     Lonzy Mato H

## 2013-04-11 NOTE — Progress Notes (Signed)
Pt given Gastric Bypass Care Guide. We discussed Day of surgery and POD #1 expectations. Pt has been compliant and understanding with using her IS every hour while awake. She is also doing her TCDB. She verbalized understanding of her activities and the plan of care for tomorrow. Will continue to assess any further learning needs that may arise.

## 2013-04-11 NOTE — Progress Notes (Signed)
Urine pregnancy test discontinued. Patient has been postmenapausal for 6 years

## 2013-04-11 NOTE — Progress Notes (Signed)
Pt ambulated around the nurses station without difficulty. Denied any shortness of breath, dizziness, lightheadedness. She also sat up in the chair after walking for about . Tolerated this well. Assisted pt back to bed in which she was more independent in her activities.

## 2013-04-11 NOTE — Anesthesia Postprocedure Evaluation (Signed)
  Anesthesia Post-op Note  Patient: Leslie Krueger  Procedure(s) Performed: Procedure(s) (LRB): LAPAROSCOPIC ROUX-EN-Y GASTRIC BYPASS WITH UPPER ENDOSCOPY (N/A)  Patient Location: PACU  Anesthesia Type: General  Level of Consciousness: awake and alert   Airway and Oxygen Therapy: Patient Spontanous Breathing  Post-op Pain: mild  Post-op Assessment: Post-op Vital signs reviewed, Patient's Cardiovascular Status Stable, Respiratory Function Stable, Patent Airway and No signs of Nausea or vomiting  Last Vitals:  Filed Vitals:   04/11/13 1130  BP: 138/55  Pulse: 88  Temp:   Resp: 15    Post-op Vital Signs: stable   Complications: No apparent anesthesia complications

## 2013-04-11 NOTE — Progress Notes (Signed)
Patient brought in her home cpap, tubing, and nasal pillows.  Cpap was setup and sterile water added to humidity chamber.  RN aware.  Pt stated she has wore it for years and feels comfortable putting it on herself later when ready.  Pt was advised that RT is available should she need further assistance.  No frays on cord or obvious defects noted with home unit.  Cpap was powered on and appears to be working at this time.  Call will be placed for [employer] to inspect machine.

## 2013-04-11 NOTE — Op Note (Signed)
Upper GI endoscopy is performed at the completion of laparoscopic Roux-en-Y gastric bypass by Dr. Ezzard Standing. The Olympus video endoscope was inserted into the upper esophagus and then passed under direct vision to the EG junction. The small gastric pouch was insufflated with air while the gastric outlet was clamped under irrigation by the operating surgeon. There was no evidence of leak. The anastomosis was visualized and was patent. Suture and staple lines were intact and without bleeding. The pouch was tubular and measured 4-5 cm in length. At the completion of the procedure the pouch was desufflated and the scope withdrawn.  Leslie Saa MD, FACS  04/11/2013, 10:19 AM

## 2013-04-11 NOTE — Progress Notes (Signed)
NOS note:  Doing well in room. Has not been out of bed yet, but plans to in next hour.  BP 122/68  Pulse 70  Temp(Src) 97 F (36.1 C) (Oral)  Resp 1  Ht 5\' 8"  (1.727 m)  Wt 299 lb 8 oz (135.852 kg)  BMI 45.55 kg/m2  SpO2 99%  Abdomen:  Wounds look good.  Hgb - 11.4  Ovidio Kin, MD, [facility] Surgery Pager: [phone] Office phone:  [phone]

## 2013-04-12 ENCOUNTER — Encounter (HOSPITAL_COMMUNITY): Payer: Self-pay | Admitting: Surgery

## 2013-04-12 ENCOUNTER — Inpatient Hospital Stay (HOSPITAL_COMMUNITY): Payer: BC Managed Care – PPO

## 2013-04-12 DIAGNOSIS — Z09 Encounter for follow-up examination after completed treatment for conditions other than malignant neoplasm: Secondary | ICD-10-CM

## 2013-04-12 LAB — GLUCOSE, CAPILLARY
Glucose-Capillary: 90 mg/dL (ref 70–99)
Glucose-Capillary: 101 mg/dL — ABNORMAL HIGH (ref 70–99)
Glucose-Capillary: 95 mg/dL (ref 70–99)
Glucose-Capillary: 106 mg/dL — ABNORMAL HIGH (ref 70–99)
Glucose-Capillary: 102 mg/dL — ABNORMAL HIGH (ref 70–99)

## 2013-04-12 LAB — CBC WITH DIFFERENTIAL/PLATELET
Basophils Absolute: 0 10*3/uL (ref 0.0–0.1)
Basophils Relative: 0 % (ref 0–1)
Eosinophils Absolute: 0 10*3/uL (ref 0.0–0.7)
HCT: 32.8 % — ABNORMAL LOW (ref 36.0–46.0)
Hemoglobin: 10.7 g/dL — ABNORMAL LOW (ref 12.0–15.0)
Lymphocytes Relative: 19 % (ref 12–46)
Lymphs Abs: 1.3 10*3/uL (ref 0.7–4.0)
MCV: 90.6 fL (ref 78.0–100.0)
Monocytes Relative: 9 % (ref 3–12)
Neutro Abs: 5.1 10*3/uL (ref 1.7–7.7)
RBC: 3.62 MIL/uL — ABNORMAL LOW (ref 3.87–5.11)
WBC: 7 10*3/uL (ref 4.0–10.5)

## 2013-04-12 MED ORDER — IOHEXOL 300 MG/ML  SOLN
50.0000 mL | Freq: Once | INTRAMUSCULAR | Status: AC | PRN
  Administered 2013-04-12: 40 mL via ORAL

## 2013-04-12 NOTE — Progress Notes (Signed)
General Surgery Note  LOS: 1 day  POD -   1 Day Post-Op  Assessment/Plan: 1.  LAPAROSCOPIC ROUX-EN-Y GASTRIC BYPASS WITH UPPER ENDOSCOPY  Passed UGI - to start clear liq  Knows to ambulate.  2. Diabetes mellitus  Gluc - 04/12/2013  3. History of heart murmur.  4. Sleep apnea - on CPAP since 2005   5. Hypertension since 2005  6. Gout  7. Back trouble  8. Depression  9. Bursitis/tendonitis - right knee and ankle 10.  DVT prophylaxis - SQ heparin  Subjective:  Doing well.  Tolerated UGI well.  Has enjoyed taking the water. Objective:   Filed Vitals:   04/12/13 1000  BP: 144/66  Pulse: 66  Temp: 98.2 F (36.8 C)  Resp: 18     Intake/Output from previous day:  09/09 0701 - 09/10 0700 In: 4225 [I.V.:4225] Out: 935 [Urine:835; Blood:100]  Intake/Output this shift:  Total I/O In: 1145 [I.V.:1145] Out: 400 [Urine:400]   Physical Exam:   General: WN obese WF who is alert and oriented.    HEENT: Normal. Pupils equal. .   Lungs: Clear.   Abdomen: Soft.   Wound: Wounds look good.   Lab Results:    Recent Labs  04/11/13 1646 04/12/13 0430  WBC  --  7.0  HGB 11.4* 10.7*  HCT 35.9* 32.8*  PLT  --  253    BMET  No results found for this basename: NA, K, CL, CO2, GLUCOSE, BUN, CREATININE, CALCIUM,  in the last 72 hours  PT/INR  No results found for this basename: LABPROT, INR,  in the last 72 hours  ABG  No results found for this basename: PHART, PCO2, PO2, HCO3,  in the last 72 hours   Studies/Results:  Dg Ugi W/water Sol Cm  04/12/2013   *RADIOLOGY REPORT*  Clinical Data:  Status post gastric bypass yesterday.  UPPER GI SERIES WITH KUB  Technique:  Routine upper GI series was performed with water soluble contrast.  Fluoroscopy Time: 15 seconds  Comparison:  Upper GI 11/29/2012  Findings: Scout film shows a nonobstructive bowel gas pattern. Surgical clips are identified in the right upper quadrant of the abdomen.  With administration of water soluble contrast, the  anastomosis appears normal.  There is no evidence for leak or stricture.  No evidence for obstruction.  The patient tolerated the procedure well.  IMPRESSION: No evidence for postprocedure complications.   Original Report Authenticated By: Norva Pavlov, M.D.     Anti-infectives:   Anti-infectives   Start     Dose/Rate Route Frequency Ordered Stop   04/11/13 0509  cefOXitin (MEFOXIN) 2 g in dextrose 5 % 50 mL IVPB     2 g 100 mL/hr over 30 Minutes Intravenous On call to O.R. 04/11/13 0509 04/11/13 0736      Ovidio Kin, MD, FACS Pager: [phone],   [facility] [place] [facility] Office: [phone] 04/12/2013

## 2013-04-12 NOTE — Care Management Note (Signed)
    Page 1 of 1   04/12/2013     1:58:26 PM   CARE MANAGEMENT NOTE 04/12/2013  Patient:  Leslie Krueger, Leslie Krueger   Account Number:  [id number]  Date Initiated:  04/12/2013  Documentation initiated by:  Lorenda Ishihara  Subjective/Objective Assessment:   62 yo female admitted s/p lap gastric bypass. PTA lived at home alone.     Action/Plan:   Home when stable   Anticipated DC Date:  04/13/2013   Anticipated DC Plan:  HOME/SELF CARE      DC Planning Services  CM consult      Choice offered to / List presented to:             Status of service:  Completed, signed off Medicare Important Message given?   (If response is "NO", the following Medicare IM given date fields will be blank) Date Medicare IM given:   Date Additional Medicare IM given:    Discharge Disposition:  HOME/SELF CARE  Per UR Regulation:  Reviewed for med. necessity/level of care/duration of stay  If discussed at Long Length of Stay Meetings, dates discussed:    Comments:

## 2013-04-12 NOTE — Progress Notes (Signed)
Bilateral lower extremity venous duplex:  No evidence of DVT, superficial thrombosis, or Baker's Cyst.   

## 2013-04-12 NOTE — Progress Notes (Signed)
Pt found already wearing home cpap with nasal pillows.  Pt is tolerating well at this time.  Pt was advised that RT is available all night should she need further assistance.

## 2013-04-13 LAB — CBC WITH DIFFERENTIAL/PLATELET
Lymphocytes Relative: 21 % (ref 12–46)
Lymphs Abs: 1.4 10*3/uL (ref 0.7–4.0)
MCV: 92.2 fL (ref 78.0–100.0)
Neutro Abs: 4.4 10*3/uL (ref 1.7–7.7)
Neutrophils Relative %: 69 % (ref 43–77)
Platelets: 223 10*3/uL (ref 150–400)
RBC: 3.57 MIL/uL — ABNORMAL LOW (ref 3.87–5.11)
WBC: 6.4 10*3/uL (ref 4.0–10.5)

## 2013-04-13 LAB — GLUCOSE, CAPILLARY
Glucose-Capillary: 102 mg/dL — ABNORMAL HIGH (ref 70–99)
Glucose-Capillary: 87 mg/dL (ref 70–99)
Glucose-Capillary: 90 mg/dL (ref 70–99)
Glucose-Capillary: 98 mg/dL (ref 70–99)

## 2013-04-13 NOTE — Progress Notes (Signed)
Patient alert and oriented, pain is controlled. Patient is tolerating fluids, plan to advance to protein shake today.  Reviewed Gastric Bypass discharge instructions with patient and patient is able to articulate understanding.  GASTRIC BYPASS / SLEEVE  Home Care Instructions  These instructions are to help you care for yourself when you go home.  Call: If you have any problems.   Call [phone] and ask for the surgeon on call   If you need immediate assistance come to the ER at [facility]. Tell the ER staff that you are a new post-op gastric bypass or gastric sleeve patient   Signs and symptoms to report:   Severe vomiting or nausea o If you cannot handle clear liquids for longer than 1 day, call your surgeon    Abdominal pain which does not get better after taking your pain medication   Fever greater than 100.4 F and chills   Heart rate over 100 beats a minute   Trouble breathing   Chest pain    Redness, swelling, drainage, or foul odor at incision (surgical) sites    If your incisions open or pull apart   Swelling or pain in calf (lower leg)   Diarrhea (Loose bowel movements that happen often), frequent watery, uncontrolled bowel movements   Constipation, (no bowel movements for 3 days) if this happens:  o Take Milk of Magnesia, 2 tablespoons by mouth, 3 times a day for 2 days if needed o Stop taking Milk of Magnesia once you have had a bowel movement o Call your doctor if constipation continues Or o Take Miralax  (instead of Milk of Magnesia) following the label instructions o Stop taking Miralax once you have had a bowel movement o Call your doctor if constipation continues   Anything you think is "abnormal for you"   Normal side effects after surgery:   Unable to sleep at night or unable to concentrate   Irritability   Being tearful (crying) or depressed These are common complaints, possibly related to your anesthesia, stress of surgery and change in lifestyle, that  usually go away a few weeks after surgery.  If these feelings continue, call your medical doctor.  Wound Care: You may have surgical glue, steri-strips, or staples over your incisions after surgery   Surgical glue:  Looks like a clear film over your incisions and will wear off a little at a time   Steri-strips : Adhesive strips of tape over your incisions. You may notice a yellowish color on the skin under the steri-strips. This is used to make the   steri-strips stick better. Do not pull the steri-strips off - let them fall off   Staples: Staples may be removed before you leave the hospital o If you go home with staples, call [facility] at for an appointment with your surgeon's nurse to have staples removed 10 days after surgery, [phone]   Showering: You may shower two (2) days after your surgery unless your surgeon tells you differently o Wash gently around incisions with warm soapy water, rinse well, and gently pat dry  o If you have a drain (tube from your incision), you may need someone to hold this while you shower  o No tub baths until staples are removed and incisions are healed     Medications:   Medications should be liquid or crushed if larger than the size of a dime   Extended release pills (medication that releases a little bit at a time   through the day) should not be crushed   Depending on the size and number of medications you take, you may need to space (take a few throughout the day)/change the time you take your medications so that you do not over-fill your pouch (smaller stomach)   Make sure you follow-up with your primary care physician to make medication changes needed during rapid weight loss and life-style changes   If you have diabetes, follow up with the doctor that orders your diabetes medication(s) within one week after surgery and check your blood sugar regularly.   Do not drive while taking narcotics (pain medications)   Do not take acetaminophen  (Tylenol) and Roxicet or Lortab Elixir at the same time since these pain medications contain acetaminophen  Diet:                    First 2 Weeks  You will see the nutritionist about two (2) weeks after your surgery. The nutritionist will increase the types of foods you can eat if you are handling liquids well:   If you have severe vomiting or nausea and cannot handle clear liquids lasting longer than 1 day, call your surgeon  Protein Shake   Drink at least 2 ounces of shake 5-6 times per day   Each serving of protein shakes (usually 8 - 12 ounces) should have a minimum of:  o 15 grams of protein  o And no more than 5 grams of carbohydrate    Goal for protein each day: o Men = 80 grams per day o Women = 60 grams per day   Protein powder may be added to fluids such as non-fat milk or Lactaid milk or Soy milk (limit to 35 grams added protein powder per serving)  Hydration   Slowly increase the amount of water and other clear liquids as tolerated (See Acceptable Fluids)   Slowly increase the amount of protein shake as tolerated     Sip fluids slowly and throughout the day   May use sugar substitutes in small amounts (no more than 6 - 8 packets per day; i.e. Splenda)  Fluid Goal   The first goal is to drink at least 8 ounces of protein shake/drink per day (or as directed by the nutritionist); some examples of protein shakes are Syntrax Nectar, Adkins Advantage, EAS Edge HP, and Unjury. See handout from pre-op Bariatric Education Class: o Slowly increase the amount of protein shake you drink as tolerated o You may find it easier to slowly sip shakes throughout the day o It is important to get your proteins in first   Your fluid goal is to drink 64 - 100 ounces of fluid daily o It may take a few weeks to build up to this   32 oz (or more) should be clear liquids  And    32 oz (or more) should be full liquids (see below for examples)   Liquids should not contain sugar, caffeine, or  carbonation  Clear Liquids:   Water or Sugar-free flavored water (i.e. Fruit H2O, Propel)   Decaffeinated coffee or tea (sugar-free)   Crystal Lite, Wyler's Lite, Minute Maid Lite   Sugar-free Jell-O   Bouillon or broth   Sugar-free Popsicle:   *Less than 20 calories each; Limit 1 per day  Full Liquids: Protein Shakes/Drinks + 2 choices per day of other full liquids   Full liquids must be: o No More Than 12 grams of Carbs per serving  o No   More Than 3 grams of Fat per serving   Strained low-fat cream soup   Non-Fat milk   Fat-free Lactaid Milk   Sugar-free yogurt (Dannon Lite & Fit, Greek yogurt)      Vitamins and Minerals   Start 1 day after surgery unless otherwise directed by your surgeon   2 Chewable Multivitamin / Multimineral Supplement with iron (i.e. Centrum for Adults)   Vitamin B-12, 350 - 500 micrograms sub-lingual (place tablet under the tongue) each day   Chewable Calcium Citrate with Vitamin D-3 (Example: 3 Chewable Calcium Plus 600 with Vitamin D-3) o Take 500 mg three (3) times a day for a total of 1500 mg each day o Do not take all 3 doses of calcium at one time as it may cause constipation, and you can only absorb 500 mg  at a time  o Do not mix multivitamins containing iron with calcium supplements; take 2 hours apart o Do not substitute Tums (calcium carbonate) for your calcium   Menstruating women and those at risk for anemia (a blood disease that causes weakness) may need extra iron o Talk with your doctor to see if you need more iron   If you need extra iron: Total daily Iron recommendation (including Vitamins) is 50 to 100 mg Iron/day   Do not stop taking or change any vitamins or minerals until you talk to your nutritionist or surgeon   Your nutritionist and/or surgeon must approve all vitamin and mineral supplements   Activity and Exercise: It is important to continue walking at home.  Limit your physical activity as instructed by your doctor.  During  this time, use these guidelines:   Do not lift anything greater than ten (10) pounds for at least two (2) weeks   Do not go back to work or drive until your surgeon says you can   You may have sex when you feel comfortable  o It is VERY important for female patients to use a reliable birth control method; fertility often increases after surgery  o Do not get pregnant for at least 18 months   Start exercising as soon as your doctor tells you that you can o Make sure your doctor approves any physical activity   Start with a simple walking program   Walk 5-15 minutes each day, 7 days per week.    Slowly increase until you are walking 30-45 minutes per day Consider joining our BELT program. [phone] or email [email or web address]   Special Instructions Things to remember:   Free counseling is available for you and your family through collaboration between [facility] and [facility]. Please call [phone] and leave a message   Use your CPAP when sleeping if this applies to you   [facility] has a free Bariatric Surgery Support Group that meets monthly, the 3rd Thursday, 6 pm, [facility] Classrooms You can see classes online at [email or web address]   It is very important to keep all follow up appointments with your surgeon, nutritionist, primary care physician, and behavioral health practitioner o After the first year, please follow up with your bariatric surgeon and nutritionist at least once a year in order to maintain best weight loss results [facility]: [phone]  [facility]: [phone] Bariatric Nurse Coordinator: [phone]     

## 2013-04-13 NOTE — Discharge Summary (Signed)
Physician Discharge Summary  Patient ID:  Leslie Krueger  MRN: [id number]  DOB/AGE: 09-12-1950 62 y.o.  Admit date: 04/11/2013 Discharge date: 04/13/2013  Discharge Diagnoses:  1. Morbid obesity   Initial weight - 311, BMI - 47   2. Diabetes mellitus - since 2008  3. History of heart murmur.  4. Sleep apnea - on CPAP since 2005  5. Hypertension since 2005  6. Gout  7. Back trouble   Attributed to weight - had PT in [place], but this has been > 6 years ago.  8. Depression  9. Bursitis/tendonitis - right knee and ankle  Operation: Procedure(s): LAPAROSCOPIC ROUX-EN-Y GASTRIC BYPASS WITH UPPER ENDOSCOPY on 04/11/2013  Discharged Condition: good  Hospital Course: Leslie Krueger is an 62 y.o. female whose primary care physician is Ninfa Linden, FNP and who was admitted 04/11/2013 with a chief complaint of Morbid obesity.   She was brought to the operating room on 04/11/2013 and underwent  LAPAROSCOPIC ROUX-EN-Y GASTRIC BYPASS WITH UPPER ENDOSCOPY.   He upper GI was okay the first post op day.  Her doppler's of her lower extremities showed no DVT.  She has tolerated water and is being advanced to protein shakes.  Her blood sugars have remained below 120.  She is ready for discharge. The discharge instructions were reviewed with the patient.  Consults: None  Significant Diagnostic Studies: Results for orders placed during the hospital encounter of 04/11/13  GLUCOSE, CAPILLARY      Result Value Range   Glucose-Capillary 121 (*) 70 - 99 mg/dL   Comment 1 Documented in Chart    HEMOGLOBIN AND HEMATOCRIT, BLOOD      Result Value Range   Hemoglobin 11.4 (*) 12.0 - 15.0 g/dL   HCT 21.3 (*) 08.6 - 57.8 %  GLUCOSE, CAPILLARY      Result Value Range   Glucose-Capillary 170 (*) 70 - 99 mg/dL  CBC WITH DIFFERENTIAL      Result Value Range   WBC 7.0  4.0 - 10.5 K/uL   RBC 3.62 (*) 3.87 - 5.11 MIL/uL   Hemoglobin 10.7 (*) 12.0 - 15.0 g/dL   HCT 46.9 (*) 62.9 - 52.8 %   MCV  90.6  78.0 - 100.0 fL   MCH 29.6  26.0 - 34.0 pg   MCHC 32.6  30.0 - 36.0 g/dL   RDW 41.3  24.4 - 01.0 %   Platelets 253  150 - 400 K/uL   Neutrophils Relative % 72  43 - 77 %   Neutro Abs 5.1  1.7 - 7.7 K/uL   Lymphocytes Relative 19  12 - 46 %   Lymphs Abs 1.3  0.7 - 4.0 K/uL   Monocytes Relative 9  3 - 12 %   Monocytes Absolute 0.6  0.1 - 1.0 K/uL   Eosinophils Relative 0  0 - 5 %   Eosinophils Absolute 0.0  0.0 - 0.7 K/uL   Basophils Relative 0  0 - 1 %   Basophils Absolute 0.0  0.0 - 0.1 K/uL  GLUCOSE, CAPILLARY      Result Value Range   Glucose-Capillary 120 (*) 70 - 99 mg/dL  GLUCOSE, CAPILLARY      Result Value Range   Glucose-Capillary 133 (*) 70 - 99 mg/dL  HEMOGLOBIN AND HEMATOCRIT, BLOOD      Result Value Range   Hemoglobin 11.0 (*) 12.0 - 15.0 g/dL   HCT 27.2 (*) 53.6 - 64.4 %  GLUCOSE, CAPILLARY  Result Value Range   Glucose-Capillary 114 (*) 70 - 99 mg/dL  GLUCOSE, CAPILLARY      Result Value Range   Glucose-Capillary 90  70 - 99 mg/dL  GLUCOSE, CAPILLARY      Result Value Range   Glucose-Capillary 101 (*) 70 - 99 mg/dL  GLUCOSE, CAPILLARY      Result Value Range   Glucose-Capillary 95  70 - 99 mg/dL  GLUCOSE, CAPILLARY      Result Value Range   Glucose-Capillary 106 (*) 70 - 99 mg/dL  CBC WITH DIFFERENTIAL      Result Value Range   WBC 6.4  4.0 - 10.5 K/uL   RBC 3.57 (*) 3.87 - 5.11 MIL/uL   Hemoglobin 10.3 (*) 12.0 - 15.0 g/dL   HCT 03.4 (*) 74.2 - 59.5 %   MCV 92.2  78.0 - 100.0 fL   MCH 28.9  26.0 - 34.0 pg   MCHC 31.3  30.0 - 36.0 g/dL   RDW 63.8  75.6 - 43.3 %   Platelets 223  150 - 400 K/uL   Neutrophils Relative % 69  43 - 77 %   Neutro Abs 4.4  1.7 - 7.7 K/uL   Lymphocytes Relative 21  12 - 46 %   Lymphs Abs 1.4  0.7 - 4.0 K/uL   Monocytes Relative 9  3 - 12 %   Monocytes Absolute 0.6  0.1 - 1.0 K/uL   Eosinophils Relative 1  0 - 5 %   Eosinophils Absolute 0.1  0.0 - 0.7 K/uL   Basophils Relative 0  0 - 1 %   Basophils Absolute  0.0  0.0 - 0.1 K/uL  GLUCOSE, CAPILLARY      Result Value Range   Glucose-Capillary 102 (*) 70 - 99 mg/dL  GLUCOSE, CAPILLARY      Result Value Range   Glucose-Capillary 98  70 - 99 mg/dL  GLUCOSE, CAPILLARY      Result Value Range   Glucose-Capillary 102 (*) 70 - 99 mg/dL    Dg Ugi W/water Sol Cm  04/12/2013   *RADIOLOGY REPORT*  Clinical Data:  Status post gastric bypass yesterday.  UPPER GI SERIES WITH KUB  Technique:  Routine upper GI series was performed with water soluble contrast.  Fluoroscopy Time: 15 seconds  Comparison:  Upper GI 11/29/2012  Findings: Scout film shows a nonobstructive bowel gas pattern. Surgical clips are identified in the right upper quadrant of the abdomen.  With administration of water soluble contrast, the anastomosis appears normal.  There is no evidence for leak or stricture.  No evidence for obstruction.  The patient tolerated the procedure well.   IMPRESSION: No evidence for postprocedure complications.   Original Report Authenticated By: Norva Pavlov, M.D.   Discharge Exam:  Filed Vitals:   04/13/13 0540  BP: 127/73  Pulse: 77  Temp: 98.7 F (37.1 C)  Resp: 18    General: WN obese WF who is alert and generally healthy appearing.  Lungs: Clear to auscultation and symmetric breath sounds. Heart:  RRR. No murmur or rub. Abdomen: Soft.  Normal bowel sounds.  Wound looks good.  Discharge Medications:     Medication List         allopurinol 300 MG tablet  Commonly known as:  ZYLOPRIM  Take 300 mg by mouth daily.     aspirin 81 MG tablet  Take 81 mg by mouth daily.     atorvastatin 40 MG tablet  Commonly known as:  LIPITOR  Take 40 mg by mouth every morning.     FLUoxetine 40 MG capsule  Commonly known as:  PROZAC  Take 40 mg by mouth every morning.     furosemide 40 MG tablet  Commonly known as:  LASIX  Take 40 mg by mouth every morning.     ibuprofen 200 MG tablet  Commonly known as:  ADVIL,MOTRIN  Take 800 mg by mouth every  6 (six) hours as needed for pain.     lisinopril 20 MG tablet  Commonly known as:  PRINIVIL,ZESTRIL  Take 20 mg by mouth every morning.     metFORMIN 500 MG 24 hr tablet  Commonly known as:  GLUCOPHAGE-XR  Take 1,000 mg by mouth daily with breakfast.     pioglitazone 30 MG tablet  Commonly known as:  ACTOS  Take 30 mg by mouth every morning.     potassium chloride 10 MEQ tablet  Commonly known as:  K-DUR,KLOR-CON  Take 10 mEq by mouth daily.     VITAMIN D (CHOLECALCIFEROL) PO  Take 5,000 Units by mouth daily.        Disposition: 01-Home or Self Care      Discharge Orders   Future Appointments Provider Department Dept Phone   04/25/2013 3:30 PM [facility] Post-Op Class [facility] [phone]   04/26/2013 9:00 AM Kandis Cocking, MD [facility] [place] [phone]   Future Orders Complete By Expires   Diet - low sodium heart healthy  As directed    Increase activity slowly  As directed       Return to work on:  05/12/2013  Activity:  Driving - May drive in 3 or 4 days, if doing well.   Lifting - No lifting > 15 pounds for 1 weeks, then no limit  Wound Care:   May shower  Diet:  Post gastric bypass diet - protein drinks.  Follow up appointment:  You have an appointment in about 2 weeks.  Call Dr. Allene Pyo office [facility]) at [phone] for any fquestion.  Medications and dosages:  Resume your home medications.  You have a prescription for:  Oxycodone.             Follow you blood sugars to decide whether to take you diabetes medications.  Signed: Ovidio Kin, M.D., FACS  04/13/2013, 9:57 AM

## 2013-04-25 ENCOUNTER — Encounter: Payer: BC Managed Care – PPO | Attending: Surgery | Admitting: *Deleted

## 2013-04-25 DIAGNOSIS — Z713 Dietary counseling and surveillance: Secondary | ICD-10-CM | POA: Insufficient documentation

## 2013-04-26 ENCOUNTER — Encounter (INDEPENDENT_AMBULATORY_CARE_PROVIDER_SITE_OTHER): Payer: Self-pay | Admitting: Surgery

## 2013-04-26 ENCOUNTER — Other Ambulatory Visit (INDEPENDENT_AMBULATORY_CARE_PROVIDER_SITE_OTHER): Payer: Self-pay

## 2013-04-26 ENCOUNTER — Ambulatory Visit (INDEPENDENT_AMBULATORY_CARE_PROVIDER_SITE_OTHER): Payer: BC Managed Care – PPO | Admitting: Surgery

## 2013-04-26 NOTE — Progress Notes (Signed)
Re:   Leslie Krueger DOB:   Mar 26, 1951 MRN:   [id number]  ASSESSMENT AND PLAN: 1.  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  I will see her back in 3 months.  To draw labs at that time.   2.  Diabetes mellitus - since Dec 24, 2006  Dropped actos.  On one metformin daily. 3.  History of heart murmur. 4.  Sleep apnea - on CPAP since Dec 24, 2003 5.  Hypertension since 12-24-2003  We talked about BP issues. 6.  Gout 7.  Back trouble  Attributed to weight - had PT in [place], but this has been > 6 years ago. 8.  Depression 9.  Bursitis/tendonitis - right knee and ankle  Chief Complaint  Patient presents with  . Bariatric Follow Up    p/o rny   REFERRING PHYSICIAN: Erasmo Downer, MD  HISTORY OF PRESENT ILLNESS: Leslie Krueger is a 62 y.o. (DOB: 06-11-1951)  white  female whose primary care physician is Strader, Alleen Borne, MD [facility].  Leslie Krueger, who was following her, has retired) and comes to me today for post op visit for RYGB.  She comes by herself. Doing well.  Saw the dietitian yesterday and has added protein.  She was ready for this.  She has some bloating/tightness around her abdomen at time.  It is associated with burping.  I think she just needs to back off her diet and go to liquids during these episodes.  I talked about getting enough liquids in. We talked about exercise.  She did do water aerobics pre op, which would be great.  History of weight problem: Patient's husband died in 2004/12/23. Her daughter died a few years later for melanoma. She moved from [place] to [place] about 24-Dec-2006.  She has tried multiple diets including Weight Watchers, [place], Slim fast, diabetic diets, and low-calorie diets. She has tried over-the-counter diet pills. She's had some success with weight loss with this [employer] and the diabetic diets. She just had trouble staying on the diet. She's also  had trouble with her right knee and ankle which limit her physical activity.  She has been to one of our information sessions, but she can not remember who spoke. She has a cousin who weighed 400-500 pounds who had gastric bypass surgery. He had a lot of nausea early after the surgery, that has gotten better and has successfully lost more than 200 pounds.   Past Medical History  Diagnosis Date  . Diabetes mellitus without complication   . Heart murmur   . Morbid obesity   . Hyperlipidemia   . Hypertension   . Depression   . OSA on CPAP     settings at 14   . Arthritis   . Anemia      Current Outpatient Prescriptions  Medication Sig Dispense Refill  . allopurinol (ZYLOPRIM) 300 MG tablet Take 300 mg by mouth daily.      Marland Kitchen aspirin 81 MG tablet Take 81 mg by mouth daily.      Marland Kitchen atorvastatin (LIPITOR) 40 MG tablet Take 40 mg by mouth every morning.       Marland Kitchen FLUoxetine (PROZAC) 40 MG capsule Take 40 mg by mouth every morning.       . furosemide (LASIX) 40 MG tablet Take 40 mg by mouth every morning.       Marland Kitchen ibuprofen (ADVIL,MOTRIN)  200 MG tablet Take 800 mg by mouth every 6 (six) hours as needed for pain.      Marland Kitchen lisinopril (PRINIVIL,ZESTRIL) 20 MG tablet Take 20 mg by mouth every morning.       . metFORMIN (GLUCOPHAGE-XR) 500 MG 24 hr tablet Take 500 mg by mouth daily with breakfast.       . potassium chloride (K-DUR,KLOR-CON) 10 MEQ tablet Take 10 mEq by mouth daily.       Marland Kitchen VITAMIN D, CHOLECALCIFEROL, PO Take 5,000 Units by mouth daily.       No current facility-administered medications for this visit.      Allergies  Allergen Reactions  . Other Nausea Only    Patient is allergic to mycins - causes nausea   . Erythromycin Nausea Only and Rash    REVIEW OF SYSTEMS: Skin:  No history of rash.  No history of abnormal moles. Infection:  No history of hepatitis or HIV.  No history of MRSA. Neurologic:  No history of stroke.  No history of seizure.  No history of  headaches. Cardiac:  Hypertension since Jan 03, 2004.  History of heart murmur.  Had a echo in [place] within the last few months, but nothing significant seen.   No history of seeing a cardiologist. Pulmonary:  Sleep apnea since 01/03/2004 - tested again last year.  Endocrine:  Diabetes since 01-03-07.   No thyroid disease. Gastrointestinal:  No history of stomach disease.  No history of liver disease.  Cholecystectomy 2010 in [place].  No history of pancreas disease.  No history of colon disease. Last colonoscopy about 10 years ago. Urologic:  No history of kidney stones.  No history of bladder infections. GYN:  G7, P2.  But daughter died of melanoma at age 28 Musculoskeletal:  Back trouble, but not seeing anyone at this time.  Dismissed as problem secondary to weight. Gout since 2004-01-03 - controled on Allopurinol.  Bursitis/tendonitis - right knee and ankle Hematologic:  No bleeding disorder.  No history of anemia.  Not anticoagulated. Psycho-social:  The patient is oriented.   She suffers from depression.  We talked about how this could affect her weight, though she downplayed it the more we talked.  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  He will not be here for the surgery. His phone #: [phone]. She lives beside her brother. She has her niece, Leslie Krueger, who has been with her before.  PHYSICAL EXAM: BP 132/76  Pulse 64  Temp(Src) 97.6 F (36.4 C) (Temporal)  Resp 14  Ht 5\' 8"  (1.727 m)  Wt 288 lb 6.4 oz (130.817 kg)  BMI 43.86 kg/m2  General: WN obese WF who is alert and generally healthy appearing.  HEENT: Normal. Pupils equal. Abdomen: Soft. No mass. No tenderness. No hernia. Normal bowel sounds.  Incisions look good.  DATA REVIEWED: No new labs.  Leslie Kin, MD,  [facility] [address] [place]  [address]   [place], [place]    [address] Phone:  [phone] FAX:  [phone]

## 2013-05-02 ENCOUNTER — Encounter (INDEPENDENT_AMBULATORY_CARE_PROVIDER_SITE_OTHER): Payer: Self-pay

## 2013-05-02 NOTE — Patient Instructions (Signed)
Follow:  Pre-Op Diet per MD 2 weeks prior to surgery  Phase 2- Liquids (clear/full) 2 weeks after surgery  Vitamin/Mineral/Calcium guidelines for purchasing bariatric supplements  Exercise guidelines pre and post-op per MD  Follow-up at [facility] in 2 weeks post-op for diet advancement. Contact Nonie Lochner at [email or web address] or [phone] as needed with questions/concerns.   

## 2013-05-07 ENCOUNTER — Encounter: Payer: Self-pay | Admitting: *Deleted

## 2013-05-07 NOTE — Patient Instructions (Signed)
Patient to follow Phase 3A-Soft, High Protein Diet and follow-up at [facility] in 6 weeks for 2 months post-op nutrition visit for diet advancement. 

## 2013-05-07 NOTE — Progress Notes (Addendum)
Bariatric Class:  Appt start time: 1530 end time:  1630.  2 Week Post-Operative Nutrition Class  Patient was seen on 04/25/13 for Post-Operative Nutrition education at [facility].   Surgery date: 04/11/13  Surgery type: RYGB  Start weight at [facility]: 312.2 lbs (11/30/12)  Pre-Op Class weight: 315.0 lbs (03/23/13)   Weight today: 289.0 lbs  Weight change: 26.0 lbs Total weight lost: 26.0 lbs  TANITA  BODY COMP RESULTS  03/23/13 04/25/13   BMI (kg/m^2) 47.9 43.9   Fat Mass (lbs) 163.0 160.0   Fat Free Mass (lbs) 152.0 129.0   Total Body Water (lbs) 111.0 94.5   The following the learning objectives were met by the patient during this course:  Identifies Phase 3A (Soft, High Proteins) Dietary Goals and will begin from 2 weeks post-operatively to 2 months post-operatively  Identifies appropriate sources of fluids and proteins   States protein recommendations and appropriate sources post-operatively  Identifies the need for appropriate texture modifications, mastication, and bite sizes when consuming solids  Identifies appropriate multivitamin and calcium sources post-operatively  Describes the need for physical activity post-operatively and will follow MD recommendations  States when to call healthcare provider regarding medication questions or post-operative complications  Handouts given during class include:  Phase 3A: Soft, High Protein Diet Handout  Samples given during class include:   Unjury Protein Powder: 2 pkts ea Lot: [id number]; Exp: 12/15  Lot: [id number]; Exp: 12/15  Follow-Up Plan: Patient will follow-up at [facility] in 4 weeks for 6 week post-op nutrition visit for diet advancement per MD.

## 2013-05-08 ENCOUNTER — Encounter (INDEPENDENT_AMBULATORY_CARE_PROVIDER_SITE_OTHER): Payer: Self-pay | Admitting: General Surgery

## 2013-06-07 ENCOUNTER — Encounter: Payer: BC Managed Care – PPO | Attending: Surgery | Admitting: Dietician

## 2013-06-07 DIAGNOSIS — Z713 Dietary counseling and surveillance: Secondary | ICD-10-CM | POA: Insufficient documentation

## 2013-06-07 NOTE — Patient Instructions (Signed)
Goals:  Follow Phase 3B: High Protein + Non-Starchy Vegetables  Eat 3-6 small meals/snacks, every 3-5 hrs  Increase lean protein foods to meet 60g goal  Increase fluid intake to 64oz +  Avoid drinking 15 minutes before, during and 30 minutes after eating  Aim for >30 min of physical activity daily  

## 2013-06-07 NOTE — Progress Notes (Addendum)
  Follow-up visit:  8  Weeks Post-Operative RYGB Surgery  Had some discomfort with food or water in her stomach but that has in since resolved.   Surgery date: 04/11/13  Surgery type: RYGB  Start weight at [facility]: 312.2 lbs (11/30/12)   Pre-Op Class weight: 315.0 lbs (03/23/13)   Weight today: 275.0 lbs  Weight change: 14.0 lbs, 22.5 lbs fat loss Total weight lost: 40 lbs Weight Loss Goal: 150 lbs  TANITA  BODY COMP RESULTS  03/23/13 04/25/13 06/07/13   BMI (kg/m^2) 47.9 43.9 41.8   Fat Mass (lbs) 163.0 160.0 137.5   Fat Free Mass (lbs) 152.0 129.0 137.5   Total Body Water (lbs) 111.0 94.5 100.5    Primary concerns today: Post-operative Bariatric Surgery Nutrition Management.  Preferred Learning Style:   No preference indicated   Learning Readiness:  Change in progress  24-hr recall: B (AM):  powdered protein in milk with PB2 (20g) Snk (AM): glass of tomato juice or V-8 juice with 1/2 container of [place] yogurt (10g protein) L (PM): meat - pot roast with sauce, Italian sausage with pepper and onions, chicken soup (about 2 oz) (14g) Snk (PM): cheese stick  (6 g) D (PM): 2 oz of meat (14 g) Snk (PM): none  Fluid intake: 20-24 oz of water, sometimes powdered protein in 8 oz milk with PB2, 8-12 oz tomato juice Estimated total protein intake: 60+ grams  Medications: reduced Metformin to one pill per day, no longer taking Actos Supplementation: Taking  CBG monitoring: 1 x day Average CBG per patient: in the 90s Last patient reported A1c: unknown  Using straws: sometimes at work Drinking while eating: No Hair loss: No Carbonated beverages: No N/V/D/C: 1 x when she ate too fast vomited Dumping syndrome: No  Recent physical activity:  No structured activity  Progress Towards Goal(s):  In progress.  Handouts given during visit include:  Phase IIIB High Protein + Non-Starchy Vegetables   Nutritional Diagnosis:  [place]-3.3 Overweight/obesity related to past poor dietary  habits and physical inactivity as evidenced by patient w/ recent RYGB surgery following dietary guidelines for continued weight loss.    Intervention:  Nutrition education/diet advancement.  Teaching Method Utilized: Visual Auditory  Barriers to learning/adherence to lifestyle change: none  Demonstrated degree of understanding via:  Teach Back   Monitoring/Evaluation:  Dietary intake, exercise, lap band fills, and body weight. Follow up in 1 months for 3 month post-op visit.

## 2013-07-05 ENCOUNTER — Encounter: Payer: BC Managed Care – PPO | Attending: Surgery | Admitting: Dietician

## 2013-07-05 DIAGNOSIS — Z713 Dietary counseling and surveillance: Secondary | ICD-10-CM | POA: Insufficient documentation

## 2013-07-05 NOTE — Progress Notes (Signed)
  Follow-up visit:  6 Month Post-Operative RYGB Surgery  Leslie Krueger returns today with a 6 lbs weight loss. Having some gas and constipation, though not bad enough to take medication.   Surgery date: 04/11/13  Surgery type: RYGB  Start weight at [facility]: 312.2 lbs (11/30/12)   Pre-Op Class weight: 315.0 lbs (03/23/13)   Weight today: 269.0 lbs  Weight change: 6.0 lbs, 9.5 lbs fat loss Total weight lost: 46 lbs Weight Loss Goal: 150 lbs  TANITA  BODY COMP RESULTS  03/23/13 04/25/13 06/07/13 07/05/13   BMI (kg/m^2) 47.9 43.9 41.8 40.9   Fat Mass (lbs) 163.0 160.0 137.5 128.0   Fat Free Mass (lbs) 152.0 129.0 137.5 141.0   Total Body Water (lbs) 111.0 94.5 100.5 103.0    Primary concerns today: Post-operative Bariatric Surgery Nutrition Management.  Preferred Learning Style:   No preference indicated   Learning Readiness:  Change in progress  24-hr recall: B (AM):  powdered protein in milk with PB2 (20g) or tomato juice or orange juice (1 x week) Snk (AM): cheese/ham and crackers or 1/2 container of [place] yogurt (10g protein) L (PM): ham, 2 oz chicken, or hamburger [place] sausage with pepper and onions, chicken soup (about 2 oz) (14g) Snk (PM): celery   D (PM): 2 oz of meat (14 g) with vegetables Snk (PM): none  Fluid intake: 20-24 oz of water, sometimes powdered protein in 8 oz milk with PB2, 8-12 oz tomato juice Estimated total protein intake: 45-60 grams  Medications: reduced Metformin to one pill per day, no longer taking Actos Supplementation: Taking  CBG monitoring: 1 x day Average CBG per patient: in the 90s Last patient reported A1c: 5.7% last month  Using straws: sometimes at work Drinking while eating: No Hair loss: No Carbonated beverages: No N/V/D/C: 1 x when she ate too fast vomited over Thanksgiving, having some constipation Dumping syndrome: No  Recent physical activity:  Walking every other day for 10 minutes  Progress Towards Goal(s):  In  progress.  Handouts given during visit include:  Phase IIIB High Protein + Non-Starchy Vegetables   Nutritional Diagnosis:  [place]-3.3 Overweight/obesity related to past poor dietary habits and physical inactivity as evidenced by patient w/ recent RYGB surgery following dietary guidelines for continued weight loss.    Intervention:  Nutrition education/diet advancement.  Teaching Method Utilized: Visual Auditory  Barriers to learning/adherence to lifestyle change: none  Demonstrated degree of understanding via:  Teach Back   Monitoring/Evaluation:  Dietary intake, exercise, lap band fills, and body weight. Follow up in 3 months for 6 month post-op visit.

## 2013-07-05 NOTE — Patient Instructions (Signed)
Goals:  Follow Phase 3B: High Protein + Non-Starchy Vegetables  Eat 3-6 small meals/snacks, every 3-5 hrs  Increase lean protein foods to meet 60g goal (add more protein at snacks if you don't have protein shake in the morning)  Increase fluid intake to 64oz +  Avoid drinking 15 minutes before, during and 30 minutes after eating  Aim for >30 min of physical activity daily - start adding some more time to walking or additional exercise  Try diluting small amount of orange juice with water/ice

## 2013-07-12 ENCOUNTER — Other Ambulatory Visit (INDEPENDENT_AMBULATORY_CARE_PROVIDER_SITE_OTHER): Payer: Self-pay

## 2013-07-12 ENCOUNTER — Encounter (INDEPENDENT_AMBULATORY_CARE_PROVIDER_SITE_OTHER): Payer: Self-pay

## 2013-07-12 ENCOUNTER — Ambulatory Visit (INDEPENDENT_AMBULATORY_CARE_PROVIDER_SITE_OTHER): Payer: BC Managed Care – PPO | Admitting: Surgery

## 2013-07-12 DIAGNOSIS — Z9884 Bariatric surgery status: Secondary | ICD-10-CM | POA: Insufficient documentation

## 2013-07-12 NOTE — Progress Notes (Signed)
Re:   Leslie Krueger DOB:   November 01, 1950 MRN:   [id number]  ASSESSMENT AND PLAN: 1.  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 Leiani Enright  Morbid obesity, Initial Weight - 311, BMI - 47  Some vague epigastric pain.  She'll call if this gets worse.  She also failed to get her labs drawn.  She works at [employer] - so she'll get those done within the week.  I will see her back in 3 months.    2.  Diabetes mellitus - since Dec 19, 2006  Dropped actos.  On one metformin daily.  HgbA1C - Nov 2014 - 5.7 (this was done through Dr. Sherren Mocha office) 3.  History of heart murmur. 4.  Sleep apnea - on CPAP since Dec 19, 2003  She thinks that she may need to decrease her pressure. 5.  Hypertension since 2003-12-19  No issues so far. 6.  Gout 7.  Back trouble  Attributed to weight - had PT in [place], but this has been > 6 years ago. 8.  Depression 9.  Bursitis/tendonitis - right knee and ankle 10.  Some epigastric pain.  I don't feel an obvious hernia, though the patient  Chief Complaint  Patient presents with  . Bariatric Follow Up    Rny   REFERRING PHYSICIAN: Erasmo Downer, MD  HISTORY OF PRESENT ILLNESS: Leslie Krueger is a 62 y.o. (DOB: [birth year])  white  female whose primary care physician is Strader, Alleen Borne, MD [facility].  Leslie Krueger, who was following her, has retired) and comes to me today for post op visit for RYGB.  She comes by herself.  She is doing well.  The one problem she has is some vague epigastric pain, she has almost daily.  The she has something that feels like a "gall bladder" attack in the LUQ about every week or two.  She is eating well and sometimes has pain when she eats.  She is not vomiting.  No change in her bowel habits. I discussed with her marginal ulcers.  I don't think we need to do something now, but she knows that if the discomfort gets worse, I would consider doing an upper endo on her. She also wonders whether she has an epigastric hernia.  But I don't  feel and obvious hernia.  She may a have small diastasis.  History of weight problem (2014): Patient's husband died in Dec 18, 2004. Her daughter died a few years later for melanoma. She moved from [place] to [place] about 12-19-06.  She has tried multiple diets including Weight Watchers, [place], Slim fast, diabetic diets, and low-calorie diets. She has tried over-the-counter diet pills. She's had some success with weight loss with this [employer] and the diabetic diets. She just had trouble staying on the diet. She's also had trouble with her right knee and ankle which limit her physical activity.  She has been to one of our information sessions, but she can not remember who spoke. She has a cousin who weighed 400-500 pounds who had gastric bypass surgery. He had a lot of nausea early after the surgery, that has gotten better and has successfully lost more than 200 pounds.   Past Medical History  Diagnosis Date  . Diabetes mellitus without complication   . Heart murmur   . Morbid obesity   . Hyperlipidemia   . Hypertension   . Depression   . OSA on CPAP     settings at 16   . Arthritis   .  Anemia      Current Outpatient Prescriptions  Medication Sig Dispense Refill  . allopurinol (ZYLOPRIM) 300 MG tablet Take 300 mg by mouth daily.      Marland Kitchen aspirin 81 MG tablet Take 81 mg by mouth daily.      Marland Kitchen atorvastatin (LIPITOR) 40 MG tablet Take 40 mg by mouth every morning.       Marland Kitchen FLUoxetine (PROZAC) 40 MG capsule Take 40 mg by mouth every morning.       . furosemide (LASIX) 40 MG tablet Take 40 mg by mouth every morning.       Marland Kitchen ibuprofen (ADVIL,MOTRIN) 200 MG tablet Take 800 mg by mouth every 6 (six) hours as needed for pain.      Marland Kitchen lisinopril (PRINIVIL,ZESTRIL) 20 MG tablet Take 20 mg by mouth every morning.       . metFORMIN (GLUCOPHAGE-XR) 500 MG 24 hr tablet Take 500 mg by mouth daily with breakfast.       . potassium chloride (K-DUR,KLOR-CON) 10 MEQ tablet Take 10 mEq by mouth daily.        Marland Kitchen VITAMIN D, CHOLECALCIFEROL, PO Take 5,000 Units by mouth daily.       No current facility-administered medications for this visit.      Allergies  Allergen Reactions  . Other Nausea Only    Patient is allergic to mycins - causes nausea   . Erythromycin Nausea Only and Rash    REVIEW OF SYSTEMS: Cardiac:  Hypertension since Jan 06, 2004.  History of heart murmur.  Had a echo in [place] within the last few months, but nothing significant seen.   No history of seeing a cardiologist. Pulmonary:  Sleep apnea since 2004/01/06 - tested again last year.  Endocrine:  Diabetes since 01/06/07.   No thyroid disease. Gastrointestinal:  No history of stomach disease.  No history of liver disease.  Cholecystectomy 2010 in [place].  No history of pancreas disease.  No history of colon disease. Last colonoscopy about 10 years ago. Urologic:  No history of kidney stones.  No history of bladder infections. GYN:  G7, P2.  But daughter died of melanoma at age 16 Musculoskeletal:  Back trouble, but not seeing anyone at this time.  Dismissed as problem secondary to weight. Gout since 01/06/04 - controled on Allopurinol.  Bursitis/tendonitis - right knee and ankle Hematologic:  No bleeding disorder.  No history of anemia.  Not anticoagulated. Psycho-social:  The patient is oriented.   She suffers from depression.  We talked about how this could affect her weight, though she downplayed it the more we talked.  Saw Dr. 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  He will not be here for the surgery. His phone #: [phone]. She lives beside her brother. She has her niece, Victorino Dike, who has been with her before.  PHYSICAL EXAM: BP 132/76  Ht 5\' 8"  (1.727 m)  Wt 262 lb (118.842 kg)  BMI 39.85 kg/m2  General: WN obese WF who is alert and generally  healthy appearing.  HEENT: Normal. Pupils equal. Lungs:  Clear Heart:  RRR Abdomen: Soft. No mass.  No hernia. Normal bowel sounds.  Incisions okay.  She has no localized pain.  She also points out a possible hernia, but this feels like a diastasis to me.  DATA REVIEWED: No new labs.  Alphonsa Overall, MD,  [facility], [address]  [place], [place]    [place] Phone:  [phone] FAX:  [phone]

## 2013-07-26 ENCOUNTER — Ambulatory Visit (INDEPENDENT_AMBULATORY_CARE_PROVIDER_SITE_OTHER): Payer: BC Managed Care – PPO | Admitting: Surgery

## 2013-10-03 ENCOUNTER — Ambulatory Visit: Payer: BC Managed Care – PPO | Admitting: Dietician

## 2013-10-30 ENCOUNTER — Encounter: Payer: BC Managed Care – PPO | Attending: Surgery | Admitting: Dietician

## 2013-10-30 DIAGNOSIS — Z713 Dietary counseling and surveillance: Secondary | ICD-10-CM | POA: Insufficient documentation

## 2013-10-30 DIAGNOSIS — Z9884 Bariatric surgery status: Secondary | ICD-10-CM | POA: Insufficient documentation

## 2013-10-30 NOTE — Patient Instructions (Signed)
Goals:  Follow Phase 3B: High Protein + Non-Starchy Vegetables  Eat 3-6 small meals/snacks, every 3-5 hrs  Increase lean protein foods to meet 60g goal (add protein powder to oatmeal)  Increase fluid intake to 64oz +  Avoid drinking 15 minutes before, during and 30 minutes after eating  Continue exercising most days  Keep eating every 1.5-2 hours to help manage stomach discomfort  Consider limiting carbs if weight loss stalls

## 2013-10-30 NOTE — Progress Notes (Signed)
Start time: 500 End time: 520  Follow-up visit:  7 Month Post-Operative RYGB Surgery  Leslie Krueger returns today with 32 lbs weight loss. States that she is afraid to eat sometimes, though eats every 1.5 hours. Eating a lot of soup and tries to have a lot meat in it. Finds that some foods sit really heavy or cause gas. Raw vegetables did not sit well. Trying to eat protein foods first.   Surgery date: 04/11/13  Surgery type: RYGB  Start weight at [facility]: 312.2 lbs (11/30/12)   Pre-Op Class weight: 315.0 lbs (03/23/13)   Weight today: 237.0 lbs  Weight change: 32 lbs Total weight lost: 78 lbs Weight Loss Goal: 150 lbs  TANITA  BODY COMP RESULTS  03/23/13 04/25/13 06/07/13 07/05/13 10/30/13   BMI (kg/m^2) 47.9 43.9 41.8 40.9 36.0   Fat Mass (lbs) 163.0 160.0 137.5 128.0 108.0   Fat Free Mass (lbs) 152.0 129.0 137.5 141.0 129.0   Total Body Water (lbs) 111.0 94.5 100.5 103.0 94.5    Primary concerns today: Post-operative Bariatric Surgery Nutrition Management.  Preferred Learning Style:   No preference indicated   Learning Readiness:  Change in progress  24-hr recall: B (AM):  powdered protein in milk with PB2 (20g) and with strawberries or bananas or tomato juice  (1 x week) Snk (AM): cheese/ham and crackers or 1/2 container of [place] yogurt (10g protein) with oatmeal L (PM): ham, 2 oz chicken, or hamburger [place] sausage with pepper and onions, chicken soup (about 2 oz)  Adds protein power to soup (14g) Snk (PM):cracker or peanut butter and crackers D (PM): 2 oz of meat (14 g) with vegetables Snk (PM): hot chocolate, fruit, cheese and crackers, ice cream  Fluid intake: 30 oz of water, sometimes powdered protein in 16 oz milk, coffee, 8-12 oz tomato juice, unsweet tea (54-58 oz) Estimated total protein intake: close to 60 grams  Medications: reduced Metformin to one pill per day, no longer taking Actos Supplementation: Taking  CBG monitoring: 1 x day Average CBG per patient: in the  70-92 mg/dl Last patient reported [id number]: 5.7% last time  Using straws: Not usually  Drinking while eating: No Hair loss: a little bit  Carbonated beverages: No N/V/D/C: will vomit if she overeats - usually a social situation, having some constipation Dumping syndrome: No  Recent physical activity:  60 minutes of walking everyday   Progress Towards Goal(s):  In progress.    Nutritional Diagnosis:  [place]-3.3 Overweight/obesity related to past poor dietary habits and physical inactivity as evidenced by patient w/ recent RYGB surgery following dietary guidelines for continued weight loss.    Intervention:  Nutrition education/diet advancement.  Teaching Method Utilized: Visual Auditory  Barriers to learning/adherence to lifestyle change: none  Demonstrated degree of understanding via:  Teach Back   Monitoring/Evaluation:  Dietary intake, exercise, lap band fills, and body weight. Follow up in 3 months for 9-10 month post-op visit.

## 2013-11-09 ENCOUNTER — Telehealth (INDEPENDENT_AMBULATORY_CARE_PROVIDER_SITE_OTHER): Payer: Self-pay

## 2013-11-09 NOTE — Telephone Encounter (Signed)
Pt called in stating she lost her lab orders you mailed her. Please mail them again and make sure they are for [facility]. She would also like you to call her with a 3 month f/u appt since I cannot see any open spots.

## 2013-12-20 ENCOUNTER — Encounter (INDEPENDENT_AMBULATORY_CARE_PROVIDER_SITE_OTHER): Payer: Self-pay

## 2014-01-03 ENCOUNTER — Encounter (INDEPENDENT_AMBULATORY_CARE_PROVIDER_SITE_OTHER): Payer: Self-pay

## 2014-01-31 ENCOUNTER — Ambulatory Visit: Payer: BC Managed Care – PPO | Admitting: Dietician

## 2014-02-24 ENCOUNTER — Other Ambulatory Visit (INDEPENDENT_AMBULATORY_CARE_PROVIDER_SITE_OTHER): Payer: Self-pay | Admitting: Surgery

## 2014-02-24 LAB — CBC WITH DIFFERENTIAL/PLATELET
Basophils Absolute: 0.1 10*3/uL (ref 0.0–0.1)
Basophils Relative: 1 % (ref 0–1)
Eosinophils Absolute: 0.1 10*3/uL (ref 0.0–0.7)
Eosinophils Relative: 2 % (ref 0–5)
HCT: 39.2 % (ref 36.0–46.0)
Hemoglobin: 13 g/dL (ref 12.0–15.0)
LYMPHS ABS: 2 10*3/uL (ref 0.7–4.0)
LYMPHS PCT: 35 % (ref 12–46)
MCH: 28 pg (ref 26.0–34.0)
MCHC: 33.2 g/dL (ref 30.0–36.0)
MCV: 84.5 fL (ref 78.0–100.0)
Monocytes Absolute: 0.3 10*3/uL (ref 0.1–1.0)
Monocytes Relative: 6 % (ref 3–12)
NEUTROS ABS: 3.2 10*3/uL (ref 1.7–7.7)
NEUTROS PCT: 56 % (ref 43–77)
PLATELETS: 339 10*3/uL (ref 150–400)
RBC: 4.64 MIL/uL (ref 3.87–5.11)
RDW: 13.6 % (ref 11.5–15.5)
WBC: 5.8 10*3/uL (ref 4.0–10.5)

## 2014-02-24 LAB — COMPREHENSIVE METABOLIC PANEL
ALBUMIN: 3.7 g/dL (ref 3.5–5.2)
ALK PHOS: 103 U/L (ref 39–117)
ALT: 12 U/L (ref 0–35)
AST: 16 U/L (ref 0–37)
BUN: 21 mg/dL (ref 6–23)
CALCIUM: 9.5 mg/dL (ref 8.4–10.5)
CHLORIDE: 102 meq/L (ref 96–112)
CO2: 29 mEq/L (ref 19–32)
Creat: 0.88 mg/dL (ref 0.50–1.10)
Glucose, Bld: 137 mg/dL — ABNORMAL HIGH (ref 70–99)
POTASSIUM: 4.5 meq/L (ref 3.5–5.3)
SODIUM: 140 meq/L (ref 135–145)
Total Bilirubin: 0.5 mg/dL (ref 0.2–1.2)
Total Protein: 6.4 g/dL (ref 6.0–8.3)

## 2014-02-24 LAB — VITAMIN B12: Vitamin B-12: 954 pg/mL — ABNORMAL HIGH (ref 211–911)

## 2014-02-24 LAB — FOLATE: Folate: 16.6 ng/mL

## 2014-02-24 LAB — FERRITIN: Ferritin: 60 ng/mL (ref 10–291)

## 2014-02-24 LAB — MAGNESIUM: MAGNESIUM: 2 mg/dL (ref 1.5–2.5)

## 2014-02-28 ENCOUNTER — Encounter (INDEPENDENT_AMBULATORY_CARE_PROVIDER_SITE_OTHER): Payer: Self-pay | Admitting: Surgery

## 2014-02-28 ENCOUNTER — Ambulatory Visit (INDEPENDENT_AMBULATORY_CARE_PROVIDER_SITE_OTHER): Payer: BC Managed Care – PPO | Admitting: Surgery

## 2014-02-28 VITALS — BP 126/76 | HR 65 | Temp 98.0°F | Resp 18 | Ht 68.0 in | Wt 221.0 lb

## 2014-02-28 DIAGNOSIS — Z9884 Bariatric surgery status: Secondary | ICD-10-CM

## 2014-02-28 LAB — VITAMIN B1: Vitamin B1 (Thiamine): 13 nmol/L (ref 8–30)

## 2014-02-28 NOTE — Progress Notes (Signed)
Re:   Leslie Krueger DOB:   Jul 28, 1951 MRN:   [id number]  ASSESSMENT AND PLAN: 1.  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 Careem Yasui  Morbid obesity, Initial Weight - 311, BMI - 47  She is down 90 pounds from her pre op weight.  She is moving to [place], outside of [place], to live with her son.  I made copies of her op note and discharge summary so she can carry that with her.  Her return here is PRN.  She knows to call if we can help her with something.  1A.  Gnawinig epigastric pain after 4 hours of not eating.  We talked about upper endoscopy, if she were to stay here.  She knows to follow up on this.  2.  Diabetes mellitus - since 12/14/2006  Dropped actos.  On one metformin daily.  HgbA1C - Nov 2014 - 5.7 (this was done through Dr. Sherren Mocha office) 3.  History of heart murmur. 4.  Sleep apnea - on CPAP since 12/14/2003  She is off the CPAP now 5.  Hypertension since 12-14-2003  On one med 6.  Gout 7.  Back trouble  Attributed to weight - had PT in [place], but this has been > 6 years ago.  This is all much better. 8.  Depression 9.  Bursitis/tendonitis - right knee and ankle  Chief Complaint  Patient presents with  . Bariatric Follow Up   REFERRING PHYSICIAN: Erasmo Downer, NP  HISTORY OF PRESENT ILLNESS: Leslie Krueger is a 63 y.o. (DOB: [birth year])  white  female whose primary care physician is [place] Ouch, Alleen Borne, NP [facility].  Ninfa Linden, who was following her, has retired) and comes to me today for post op visit for RYGB.   She comes by herself.  She is doing well.   She has some gnawing abdominal pain if she does not eat every 4 hours.  She has no pain other times and eating seems to relieve the pain.  She has no nausea or change in bowel habits. I had not seen her since last December.   She is leaving [place] to live with her son in [place]. She has retired from [employer]. She spent most last month packing and getting ready to go to [place]. She is  rating her house to her knees for several months and then plans to put it up for sale.  History of weight problem (2014): Patient's husband died in 13-Dec-2004. Her daughter died a few years later for melanoma. She moved from [place] to [place] about Dec 14, 2006.  She has tried multiple diets including Weight Watchers, [place], Slim fast, diabetic diets, and low-calorie diets. She has tried over-the-counter diet pills. She's had some success with weight loss with this [employer] and the diabetic diets. She just had trouble staying on the diet. She's also had trouble with her right knee and ankle which limit her physical activity.  She has been to one of our information sessions, but she can not remember who spoke. She has a cousin who weighed 400-500 pounds who had gastric bypass surgery. He had a lot of nausea early after the surgery, that has gotten better and has successfully lost more than 200 pounds.   Past Medical History  Diagnosis Date  . Diabetes mellitus without complication   . Heart murmur   . Morbid obesity   . Hyperlipidemia   . Hypertension   . Depression   . OSA on  CPAP     settings at 16   . Arthritis   . Anemia      Current Outpatient Prescriptions  Medication Sig Dispense Refill  . allopurinol (ZYLOPRIM) 300 MG tablet Take 300 mg by mouth daily.      Marland Kitchen aspirin 81 MG tablet Take 81 mg by mouth daily.      Marland Kitchen atorvastatin (LIPITOR) 40 MG tablet Take 40 mg by mouth every morning.       Marland Kitchen FLUoxetine (PROZAC) 40 MG capsule Take 40 mg by mouth every morning.       . furosemide (LASIX) 40 MG tablet Take 40 mg by mouth every morning.       Marland Kitchen ibuprofen (ADVIL,MOTRIN) 200 MG tablet Take 800 mg by mouth every 6 (six) hours as needed for pain.      Marland Kitchen lisinopril (PRINIVIL,ZESTRIL) 20 MG tablet Take 20 mg by mouth every morning.       . loratadine (CLARITIN) 10 MG tablet Take 10 mg by mouth daily.      . metFORMIN (GLUCOPHAGE-XR) 500 MG 24 hr tablet Take 500 mg by mouth daily with  breakfast.       . potassium chloride (K-DUR,KLOR-CON) 10 MEQ tablet Take 10 mEq by mouth daily.       Marland Kitchen VITAMIN D, CHOLECALCIFEROL, PO Take 5,000 Units by mouth daily.       No current facility-administered medications for this visit.      Allergies  Allergen Reactions  . Other Nausea Only    Patient is allergic to mycins - causes nausea   . Erythromycin Nausea Only and Rash    REVIEW OF SYSTEMS: Cardiac:  Hypertension since 12/21/2003.  History of heart murmur.  Had a echo in [place] within the last few months, but nothing significant seen.   No history of seeing a cardiologist. Pulmonary:  Sleep apnea since 2003-12-21 - tested again last year.  Endocrine:  Diabetes since Dec 21, 2006.   No thyroid disease. Gastrointestinal:  No history of stomach disease.  No history of liver disease.  Cholecystectomy 2010 in [place].  No history of pancreas disease.  No history of colon disease. Last colonoscopy about 10 years ago. Urologic:  No history of kidney stones.  No history of bladder infections. GYN:  G7, P2.  But daughter died of melanoma at age 23 Musculoskeletal:  Back trouble, but not seeing anyone at this time.  Dismissed as problem secondary to weight. Gout since Dec 21, 2003 - controled on Allopurinol.  Bursitis/tendonitis - right knee and ankle Hematologic:  No bleeding disorder.  No history of anemia.  Not anticoagulated. Psycho-social:  The patient is oriented.   She suffers from depression.  We talked about how this could affect her weight, though she downplayed it the more we talked.  Saw Dr. 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  He will not be here for the surgery. His phone #: [phone]. She lives beside her brother. She is moving to living with her son.  PHYSICAL EXAM: BP 126/76  Pulse 65  Temp(Src) 98 F (36.7 C)   Resp 18  Ht 5\' 8"  (1.727 m)  Wt 221 lb (100.245 kg)  BMI 33.61 kg/m2  General: WN WF who is alert and generally healthy appearing.  HEENT: Normal. Pupils equal. Lungs:  Clear Heart:  RRR Abdomen: Soft. No  mass.  No hernia. Normal bowel sounds.  Incisions okay.    DATA REVIEWED: No new labs.  Ovidio Kinavid Dimitrios Balestrieri, MD,  [facility] [address]  [address]   [place], [place]    [id number] Phone:  [phone] FAX:  [phone]

## 2014-04-16 IMAGING — RF DG UGI W/ GASTROGRAFIN
11 series · 11 of 11 positions shown · non-contrast
Comparison: Upper GI 11/29/2012

CLINICAL DATA: Status post gastric bypass yesterday.

UPPER GI SERIES WITH KUB
TECHNIQUE: Routine upper GI series was performed with water
soluble contrast.
Fluoroscopy Time: 15 seconds

[Series 1: run · 1 of 1 slices shown (1 of 10)]
[im 1/1]
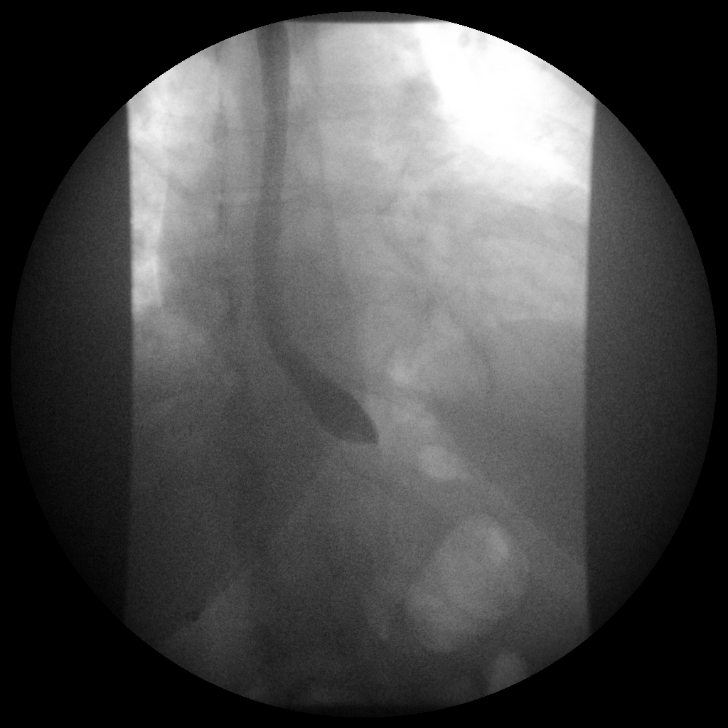

[Series 2: run · 1 of 1 slices shown (2 of 10)]
[im 1/1]
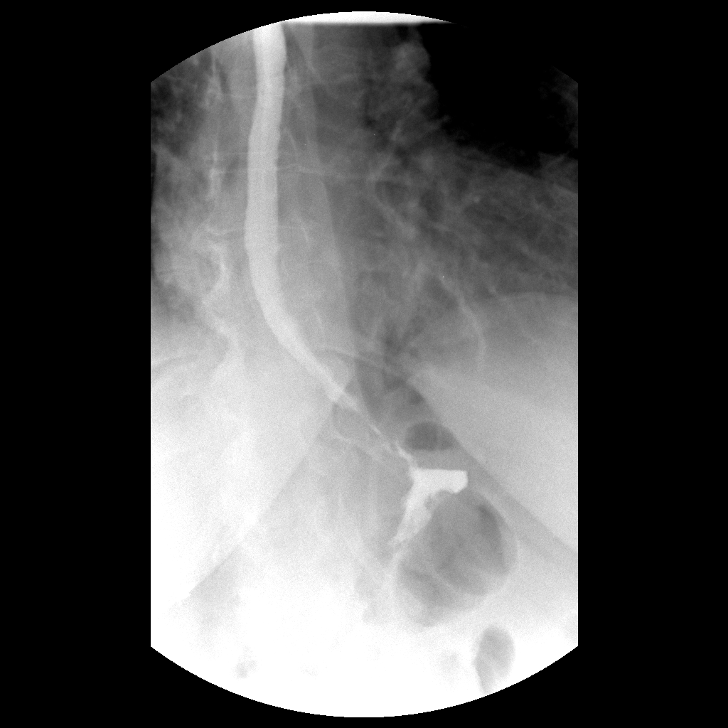

[Series 3: run · 1 of 1 slices shown (3 of 10)]
[im 1/1]
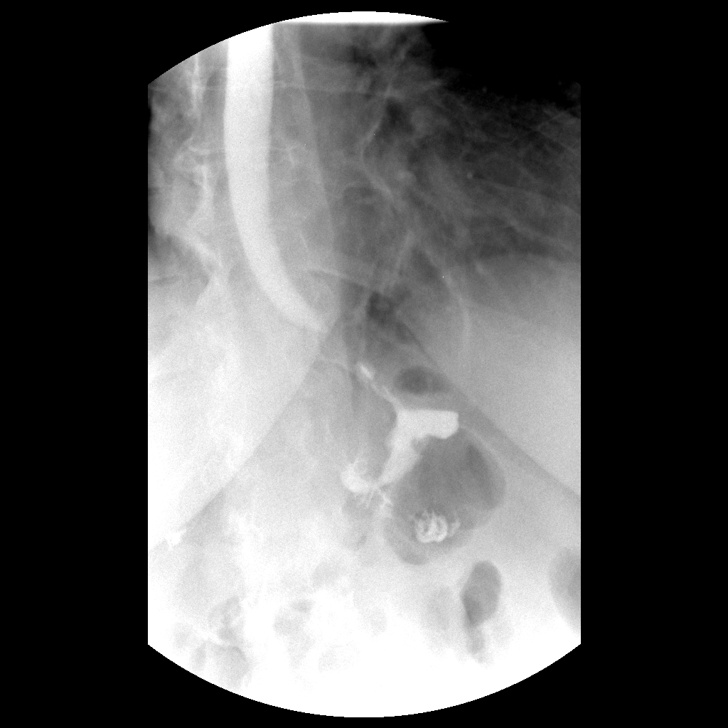

[Series 4: run · 1 of 1 slices shown (4 of 10)]
[im 1/1]
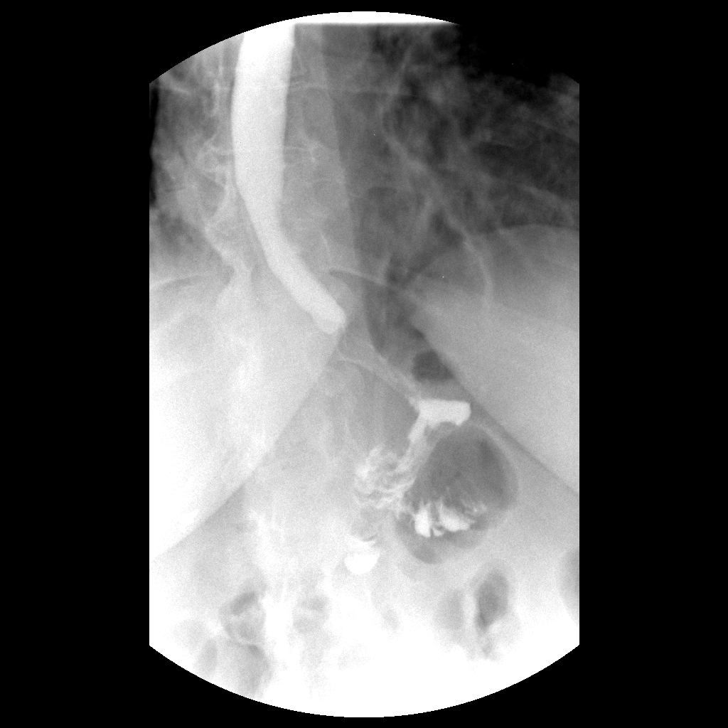

[Series 5: run · 1 of 1 slices shown (5 of 10)]
[im 1/1]
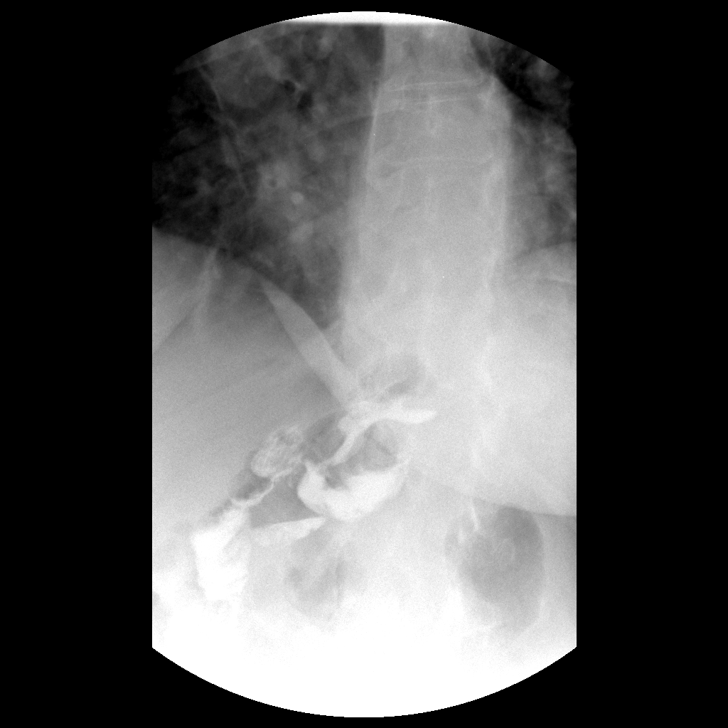

[Series 6: run · 1 of 1 slices shown (6 of 10)]
[im 1/1]
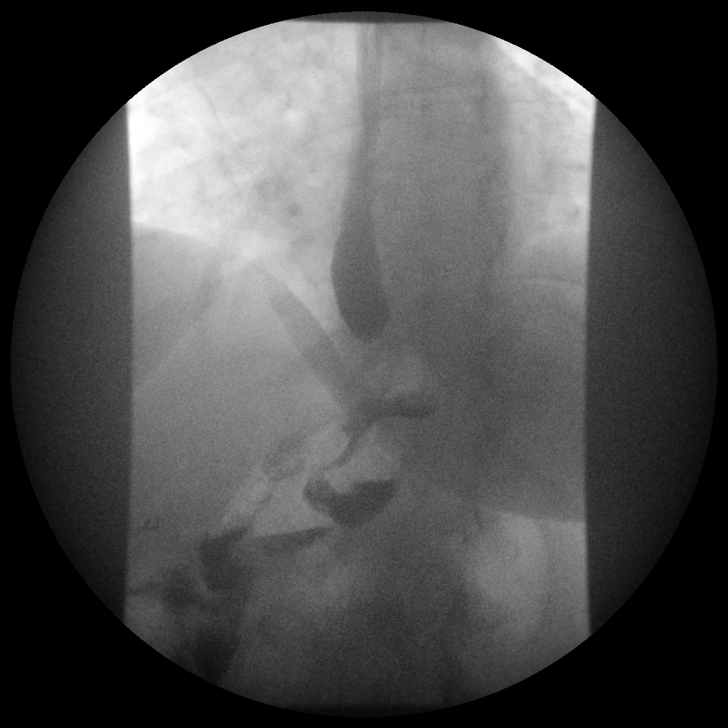

[Series 7: run · 1 of 1 slices shown (7 of 10)]
[im 1/1]
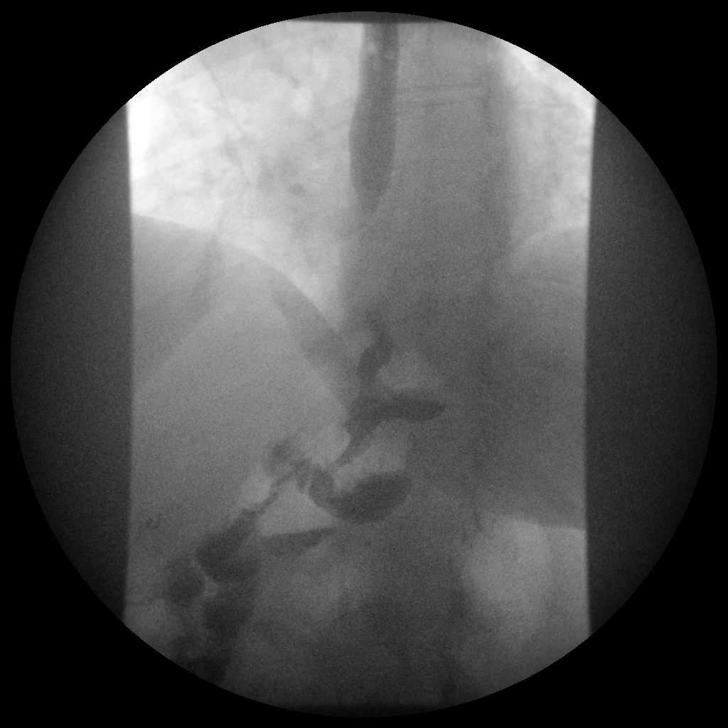

[Series 8: run · 1 of 1 slices shown (8 of 10)]
[im 1/1]
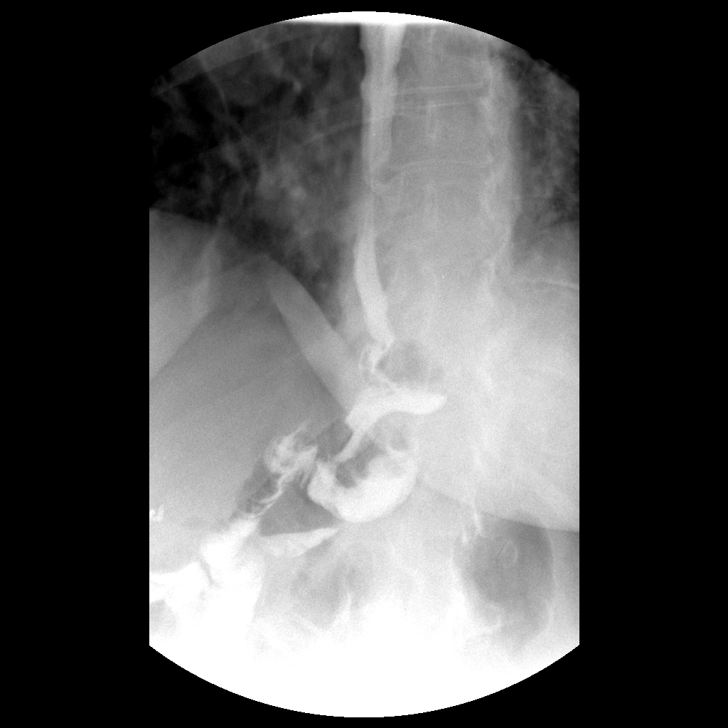

[Series 9: run · 1 of 1 slices shown (9 of 10)]
[im 1/1]
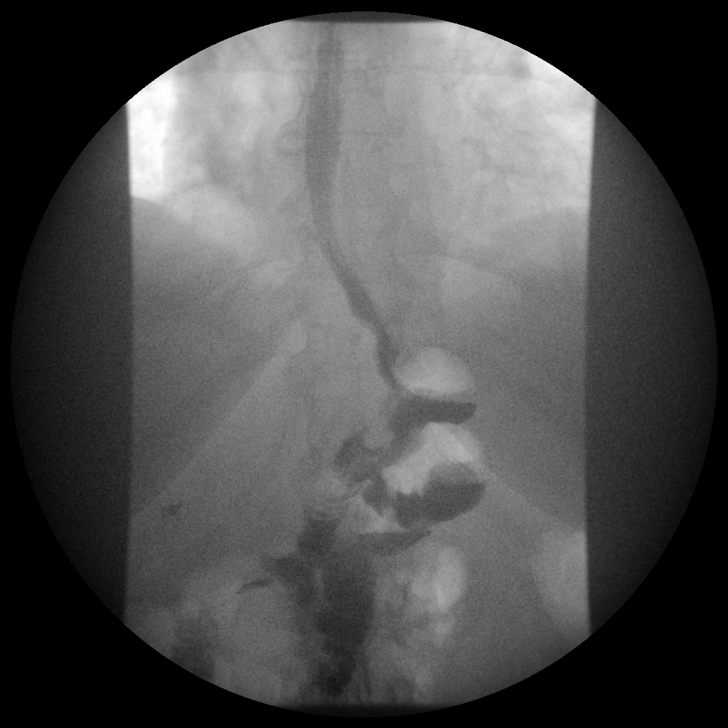

[Series 10: run · 1 of 1 slices shown (10 of 10)]
[im 1/1]
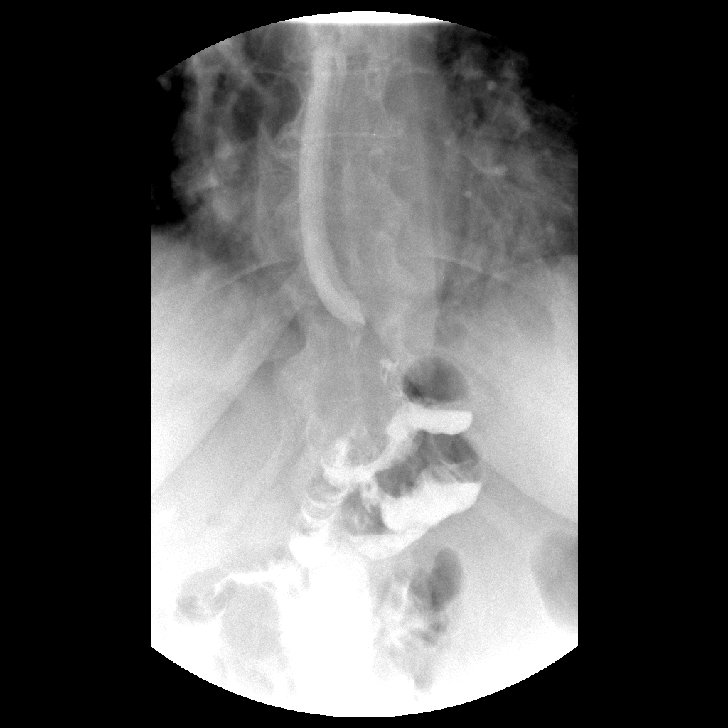

[Series 1001: view not recorded · 0.20mm/px · 1 of 1 slices shown]
[im 1/1]
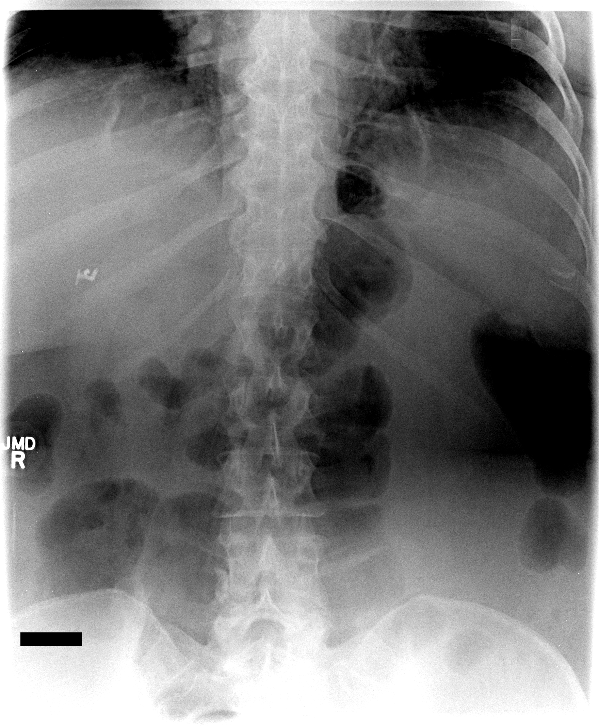

[11 of 11 positions shown; findings below may reference images not displayed]

FINDINGS: Scout film shows a nonobstructive bowel gas pattern.
Surgical clips are identified in the right upper quadrant of the
abdomen.

With administration of water soluble contrast, the anastomosis
appears normal.  There is no evidence for leak or stricture.  No
evidence for obstruction.  The patient tolerated the procedure
well.
IMPRESSION: No evidence for postprocedure complications.

## 2016-06-18 ENCOUNTER — Encounter (HOSPITAL_COMMUNITY): Payer: Self-pay
